# Patient Record
Sex: Male | Born: 1975 | Race: White | Marital: Married | Smoking: Never smoker
Health system: Southern US, Community
[De-identification: ages and names within clinical notes are randomized; demographics above are authoritative.]

## PROBLEM LIST (undated history)

## (undated) DIAGNOSIS — K5090 Crohn's disease, unspecified, without complications: Secondary | ICD-10-CM

## (undated) HISTORY — PX: COLON SURGERY: SHX602

---

## 2014-05-17 ENCOUNTER — Ambulatory Visit (HOSPITAL_COMMUNITY): Payer: PRIVATE HEALTH INSURANCE | Admitting: Psychology

## 2021-04-14 ENCOUNTER — Inpatient Hospital Stay (HOSPITAL_COMMUNITY)
Admission: EM | Admit: 2021-04-14 | Discharge: 2021-04-16 | DRG: 387 | Disposition: A | Discharge status: Home / Self Care | Payer: BC Managed Care – PPO | Attending: Internal Medicine | Admitting: Internal Medicine

## 2021-04-14 ENCOUNTER — Other Ambulatory Visit: Payer: Self-pay

## 2021-04-14 ENCOUNTER — Encounter (HOSPITAL_COMMUNITY): Payer: Self-pay

## 2021-04-14 DIAGNOSIS — E669 Obesity, unspecified: Secondary | ICD-10-CM | POA: Diagnosis present

## 2021-04-14 DIAGNOSIS — R739 Hyperglycemia, unspecified: Secondary | ICD-10-CM | POA: Diagnosis present

## 2021-04-14 DIAGNOSIS — Z20822 Contact with and (suspected) exposure to covid-19: Secondary | ICD-10-CM | POA: Diagnosis present

## 2021-04-14 DIAGNOSIS — Z6834 Body mass index (BMI) 34.0-34.9, adult: Secondary | ICD-10-CM

## 2021-04-14 DIAGNOSIS — Z951 Presence of aortocoronary bypass graft: Secondary | ICD-10-CM

## 2021-04-14 DIAGNOSIS — I2510 Atherosclerotic heart disease of native coronary artery without angina pectoris: Secondary | ICD-10-CM | POA: Diagnosis present

## 2021-04-14 DIAGNOSIS — K56609 Unspecified intestinal obstruction, unspecified as to partial versus complete obstruction: Secondary | ICD-10-CM | POA: Diagnosis present

## 2021-04-14 DIAGNOSIS — K50012 Crohn's disease of small intestine with intestinal obstruction: Principal | ICD-10-CM | POA: Diagnosis present

## 2021-04-14 DIAGNOSIS — D72829 Elevated white blood cell count, unspecified: Secondary | ICD-10-CM

## 2021-04-14 DIAGNOSIS — R112 Nausea with vomiting, unspecified: Secondary | ICD-10-CM

## 2021-04-14 DIAGNOSIS — R109 Unspecified abdominal pain: Secondary | ICD-10-CM

## 2021-04-14 HISTORY — DX: Crohn's disease, unspecified, without complications: K50.90

## 2021-04-14 MED ORDER — FENTANYL CITRATE PF 50 MCG/ML IJ SOSY
50.0000 ug | PREFILLED_SYRINGE | Freq: Once | INTRAMUSCULAR | Status: AC
  Administered 2021-04-14: 50 ug via INTRAVENOUS
  Filled 2021-04-14: qty 1

## 2021-04-14 MED ORDER — LACTATED RINGERS IV BOLUS
1000.0000 mL | Freq: Once | INTRAVENOUS | Status: AC
  Administered 2021-04-14: 1000 mL via INTRAVENOUS

## 2021-04-14 MED ORDER — ONDANSETRON HCL 4 MG/2ML IJ SOLN
4.0000 mg | Freq: Once | INTRAMUSCULAR | Status: AC
  Administered 2021-04-14: 4 mg via INTRAVENOUS
  Filled 2021-04-14: qty 2

## 2021-04-14 NOTE — ED Triage Notes (Signed)
Pt presents to Ed with abdominal pain that started yesterday. Pt has hx of Chron's disease, pt was taken off meds for this in 2008, which was his last flare up. Pt reports passing gas, last BM was yesterday. Pt became nauseated today and vomited a  couple of times.

## 2021-04-15 ENCOUNTER — Encounter (HOSPITAL_COMMUNITY): Payer: Self-pay | Admitting: Radiology

## 2021-04-15 ENCOUNTER — Emergency Department (HOSPITAL_COMMUNITY): Payer: BC Managed Care – PPO

## 2021-04-15 ENCOUNTER — Inpatient Hospital Stay (HOSPITAL_COMMUNITY): Payer: BC Managed Care – PPO

## 2021-04-15 DIAGNOSIS — Z6834 Body mass index (BMI) 34.0-34.9, adult: Secondary | ICD-10-CM | POA: Diagnosis not present

## 2021-04-15 DIAGNOSIS — D72829 Elevated white blood cell count, unspecified: Secondary | ICD-10-CM

## 2021-04-15 DIAGNOSIS — K56609 Unspecified intestinal obstruction, unspecified as to partial versus complete obstruction: Secondary | ICD-10-CM | POA: Diagnosis present

## 2021-04-15 DIAGNOSIS — R109 Unspecified abdominal pain: Secondary | ICD-10-CM

## 2021-04-15 DIAGNOSIS — Z951 Presence of aortocoronary bypass graft: Secondary | ICD-10-CM | POA: Diagnosis not present

## 2021-04-15 DIAGNOSIS — Z20822 Contact with and (suspected) exposure to covid-19: Secondary | ICD-10-CM | POA: Diagnosis present

## 2021-04-15 DIAGNOSIS — R112 Nausea with vomiting, unspecified: Secondary | ICD-10-CM

## 2021-04-15 DIAGNOSIS — I2510 Atherosclerotic heart disease of native coronary artery without angina pectoris: Secondary | ICD-10-CM | POA: Diagnosis present

## 2021-04-15 DIAGNOSIS — R1084 Generalized abdominal pain: Secondary | ICD-10-CM | POA: Diagnosis not present

## 2021-04-15 DIAGNOSIS — K50012 Crohn's disease of small intestine with intestinal obstruction: Secondary | ICD-10-CM | POA: Diagnosis present

## 2021-04-15 DIAGNOSIS — R739 Hyperglycemia, unspecified: Secondary | ICD-10-CM | POA: Diagnosis present

## 2021-04-15 DIAGNOSIS — E669 Obesity, unspecified: Secondary | ICD-10-CM | POA: Diagnosis present

## 2021-04-15 LAB — URINALYSIS, ROUTINE W REFLEX MICROSCOPIC
Bilirubin Urine: NEGATIVE
Glucose, UA: NEGATIVE mg/dL
Hgb urine dipstick: NEGATIVE
Ketones, ur: NEGATIVE mg/dL
Leukocytes,Ua: NEGATIVE
Nitrite: NEGATIVE
Protein, ur: NEGATIVE mg/dL
Specific Gravity, Urine: 1.032 — ABNORMAL HIGH (ref 1.005–1.030)
pH: 5 (ref 5.0–8.0)

## 2021-04-15 LAB — COMPREHENSIVE METABOLIC PANEL
ALT: 46 U/L — ABNORMAL HIGH (ref 0–44)
AST: 30 U/L (ref 15–41)
Albumin: 4.3 g/dL (ref 3.5–5.0)
Alkaline Phosphatase: 110 U/L (ref 38–126)
Anion gap: 10 (ref 5–15)
BUN: 17 mg/dL (ref 6–20)
CO2: 25 mmol/L (ref 22–32)
Calcium: 9.1 mg/dL (ref 8.9–10.3)
Chloride: 102 mmol/L (ref 98–111)
Creatinine, Ser: 1.2 mg/dL (ref 0.61–1.24)
GFR, Estimated: >60 mL/min (ref >60)
Glucose, Bld: 134 mg/dL — ABNORMAL HIGH (ref 70–99)
Potassium: 3.5 mmol/L (ref 3.5–5.1)
Sodium: 137 mmol/L (ref 135–145)
Total Bilirubin: 1.2 mg/dL (ref 0.3–1.2)
Total Protein: 7.7 g/dL (ref 6.5–8.1)

## 2021-04-15 LAB — CBC WITH DIFFERENTIAL/PLATELET
Abs Immature Granulocytes: 0.04 10*3/uL (ref 0.00–0.07)
Basophils Absolute: 0 10*3/uL (ref 0.0–0.1)
Basophils Relative: 0 %
Eosinophils Absolute: 0 10*3/uL (ref 0.0–0.5)
Eosinophils Relative: 0 %
HCT: 47.7 % (ref 39.0–52.0)
Hemoglobin: 16.7 g/dL (ref 13.0–17.0)
Immature Granulocytes: 0 %
Lymphocytes Relative: 18 %
Lymphs Abs: 2.7 10*3/uL (ref 0.7–4.0)
MCH: 31.3 pg (ref 26.0–34.0)
MCHC: 35 g/dL (ref 30.0–36.0)
MCV: 89.5 fL (ref 80.0–100.0)
Monocytes Absolute: 1.7 10*3/uL — ABNORMAL HIGH (ref 0.1–1.0)
Monocytes Relative: 11 %
Neutro Abs: 10.7 10*3/uL — ABNORMAL HIGH (ref 1.7–7.7)
Neutrophils Relative %: 71 %
Platelets: 291 10*3/uL (ref 150–400)
RBC: 5.33 MIL/uL (ref 4.22–5.81)
RDW: 12.8 % (ref 11.5–15.5)
WBC: 15.2 10*3/uL — ABNORMAL HIGH (ref 4.0–10.5)
nRBC: 0 % (ref 0.0–0.2)

## 2021-04-15 LAB — PROTIME-INR
INR: 1 (ref 0.8–1.2)
Prothrombin Time: 13.2 seconds (ref 11.4–15.2)

## 2021-04-15 LAB — LIPASE, BLOOD: Lipase: 29 U/L (ref 11–51)

## 2021-04-15 LAB — PHOSPHORUS: Phosphorus: 3.9 mg/dL (ref 2.5–4.6)

## 2021-04-15 LAB — RESP PANEL BY RT-PCR (FLU A&B, COVID) ARPGX2
Influenza A by PCR: NEGATIVE
Influenza B by PCR: NEGATIVE
SARS Coronavirus 2 by RT PCR: NEGATIVE

## 2021-04-15 LAB — MAGNESIUM: Magnesium: 1.7 mg/dL (ref 1.7–2.4)

## 2021-04-15 LAB — HIV ANTIBODY (ROUTINE TESTING W REFLEX): HIV Screen 4th Generation wRfx: NONREACTIVE

## 2021-04-15 LAB — APTT: aPTT: 25 seconds (ref 24–36)

## 2021-04-15 IMAGING — CT CT ABD-PELV W/ CM
2 of 5 series · 16 of 46 positions shown, 18 images · IV contrast (Omnipaque or Isovue)
Comparison: None.

CLINICAL DATA: Abdominal pain.  History of Crohn's.

EXAM:
CT ABDOMEN AND PELVIS WITH CONTRAST
TECHNIQUE: Multidetector CT imaging of the abdomen and pelvis was performed
using the standard protocol following bolus administration of
intravenous contrast.
CONTRAST:  100mL OMNIPAQUE IOHEXOL 300 MG/ML  SOLN

[Series 2: axial st · axial · 0.86mm/px · z∈[+768,+1258]mm · 13 of 110 slices shown, 15 images]
[im 6/110  soft-tissue]
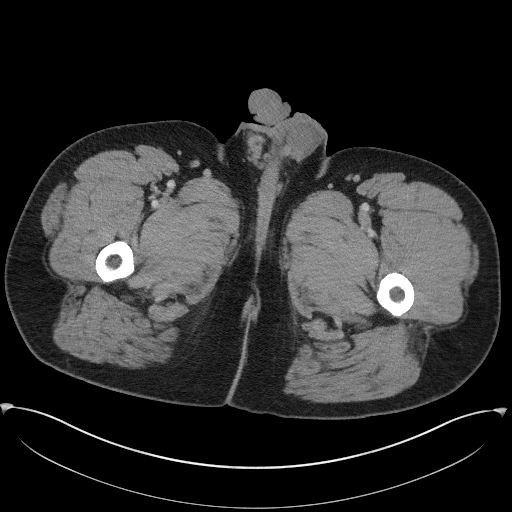
[im 6/110  bone]
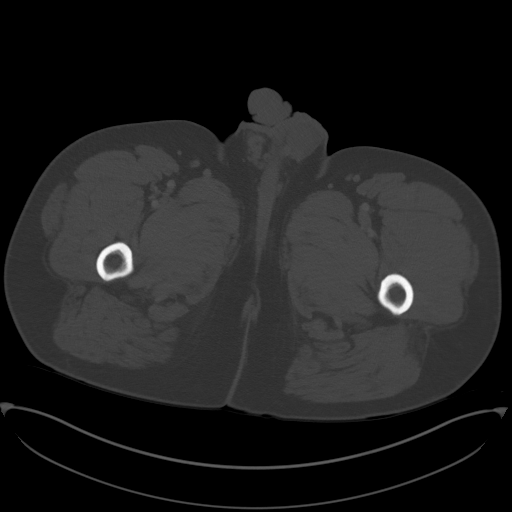
[im 18/110  soft-tissue]
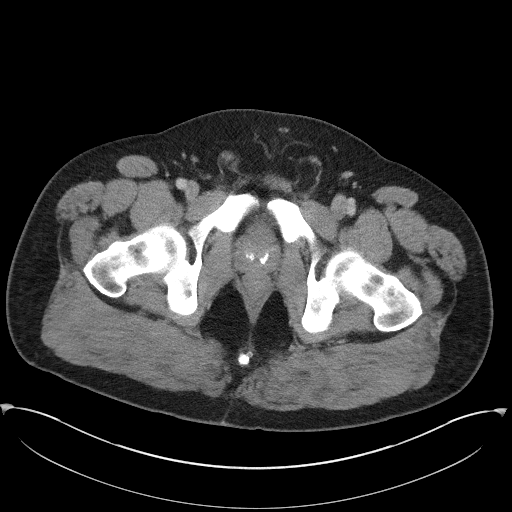
[im 23/110  soft-tissue]
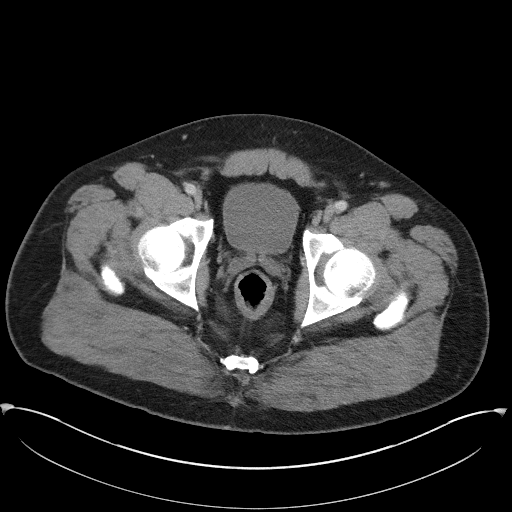
[im 29/110  soft-tissue]
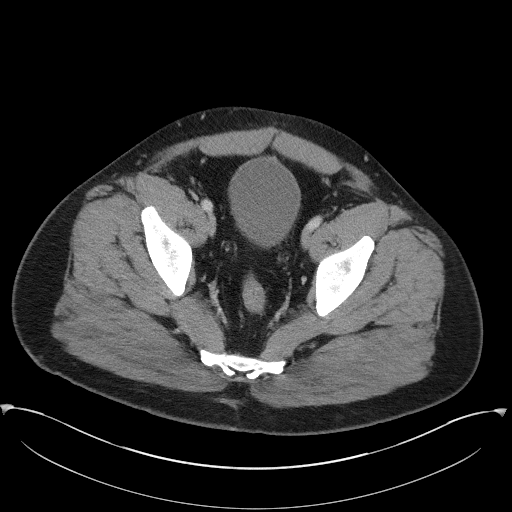
[im 41/110  soft-tissue]
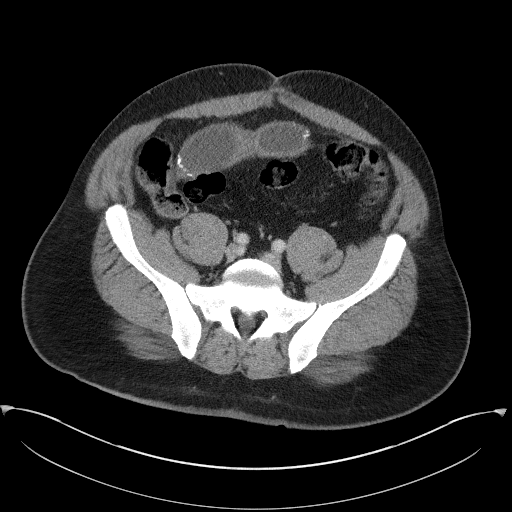
[im 46/110  soft-tissue]
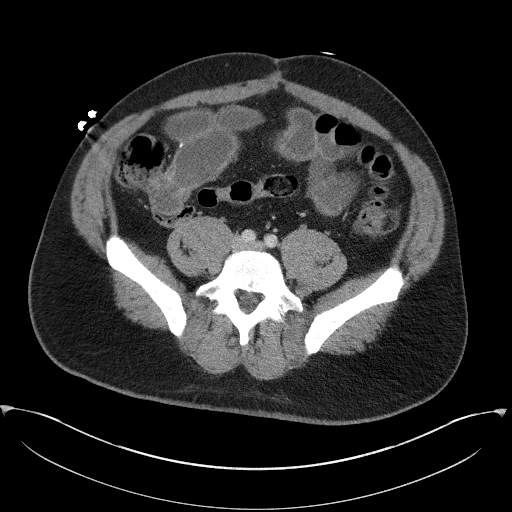
[im 58/110  soft-tissue]
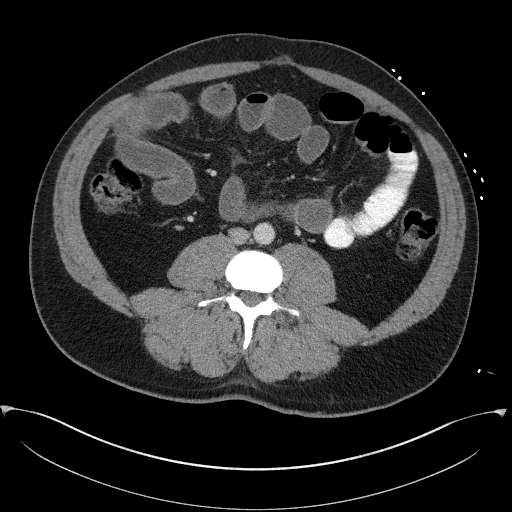
[im 64/110  soft-tissue]
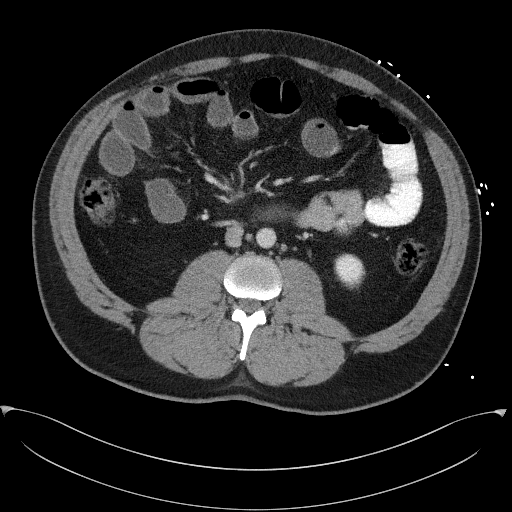
[im 69/110  soft-tissue]
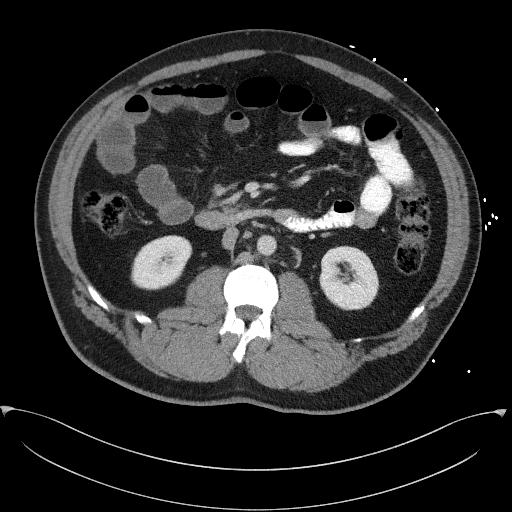
[im 69/110  bone]
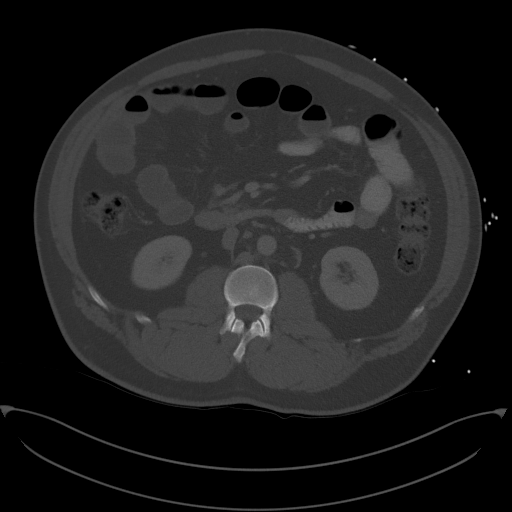
[im 81/110  soft-tissue]
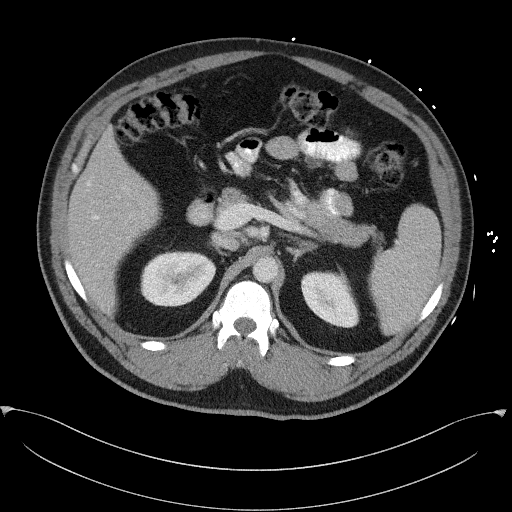
[im 87/110  soft-tissue]
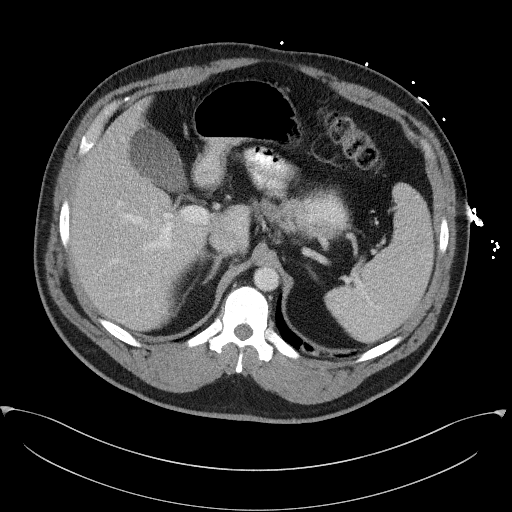
[im 92/110  soft-tissue]
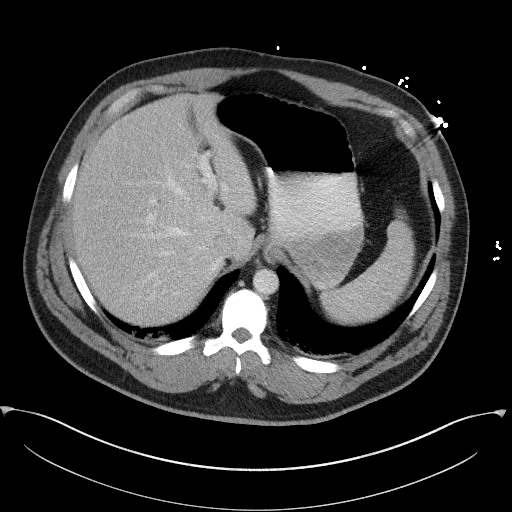
[im 104/110  soft-tissue]
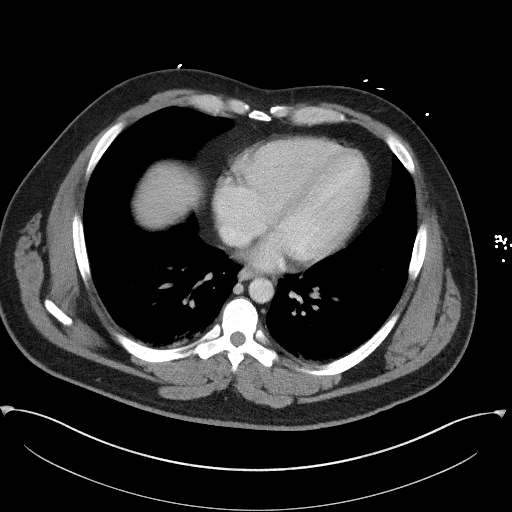

[Series 5: coronal st · coronal · 0.89mm/px · 3 of 134 slices shown]
[im 45/134  soft-tissue]
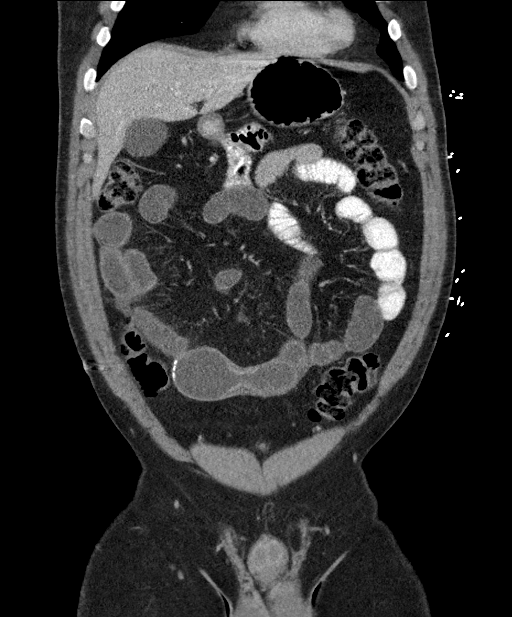
[im 60/134  soft-tissue]
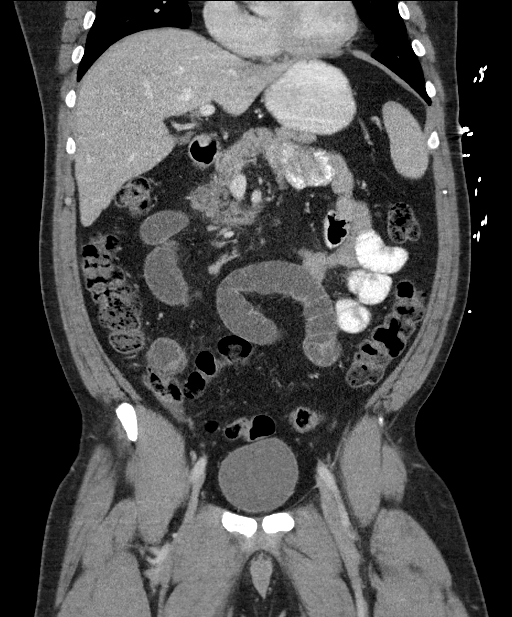
[im 74/134  soft-tissue]
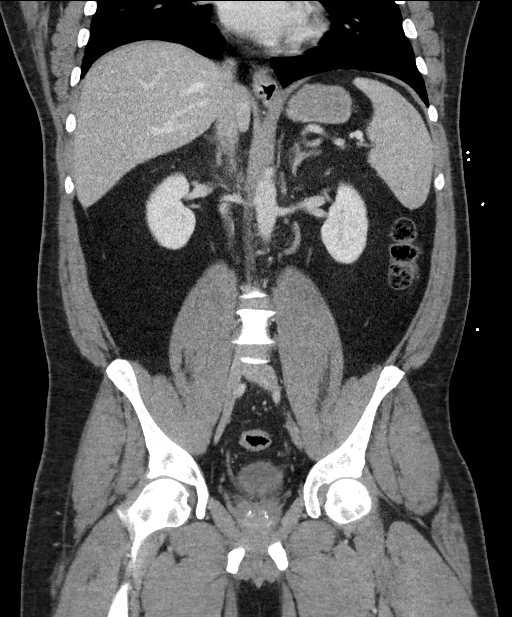

[16 of 46 positions shown; findings below may reference images not displayed]

FINDINGS: Lower chest: Minimal bibasilar dependent atelectasis. The visualized
lung bases are otherwise clear.

No intra-abdominal free air or free fluid.

Hepatobiliary: No focal liver abnormality is seen. No gallstones,
gallbladder wall thickening, or biliary dilatation.

Pancreas: Unremarkable. No pancreatic ductal dilatation or
surrounding inflammatory changes.

Spleen: Normal in size without focal abnormality.

Adrenals/Urinary Tract: The adrenal glands are unremarkable. The
kidneys, visualized ureters, and urinary bladder appear
unremarkable.

Stomach/Bowel: Postsurgical changes of bowel with anastomotic
suture. There is mild diffuse thickening of the small bowel wall in
the right hemiabdomen involving the mid and distal ileum as well as
terminal ileum. There is mild dilatation of these loops of small
bowel with fluid content. The are distal ileum measures up to 4.7 cm
in diameter. Findings concerning for recurrence of Crohn's disease
with a degree of obstruction at the level of the ileocecal valve.
The colon is unremarkable. The appendix is not visualized and may be
surgically absent.

Vascular/Lymphatic: The abdominal aorta and IVC unremarkable. No
portal venous gas. There is no adenopathy.

Reproductive: The prostate and seminal vesicles are grossly
unremarkable. No pelvic mass.

Other: None

Musculoskeletal: Midline vertical anterior pelvic wall incisional
scar. No acute osseous pathology.
IMPRESSION: Findings concerning for recurrence of Crohn's disease with a degree
of obstruction at the level of the ileocecal valve.

## 2021-04-15 IMAGING — DX DG CHEST 1V PORT
1 series · 2 of 2 positions shown · non-contrast
Comparison: CT Abdomen and Pelvis [40] hours today.

CLINICAL DATA: 45-year-old male NG tube placement.

EXAM:
PORTABLE CHEST 1 VIEW

[Series 2: chest ap grid · 0.14mm/px · 2 of 2 slices shown]
[im 1/2]
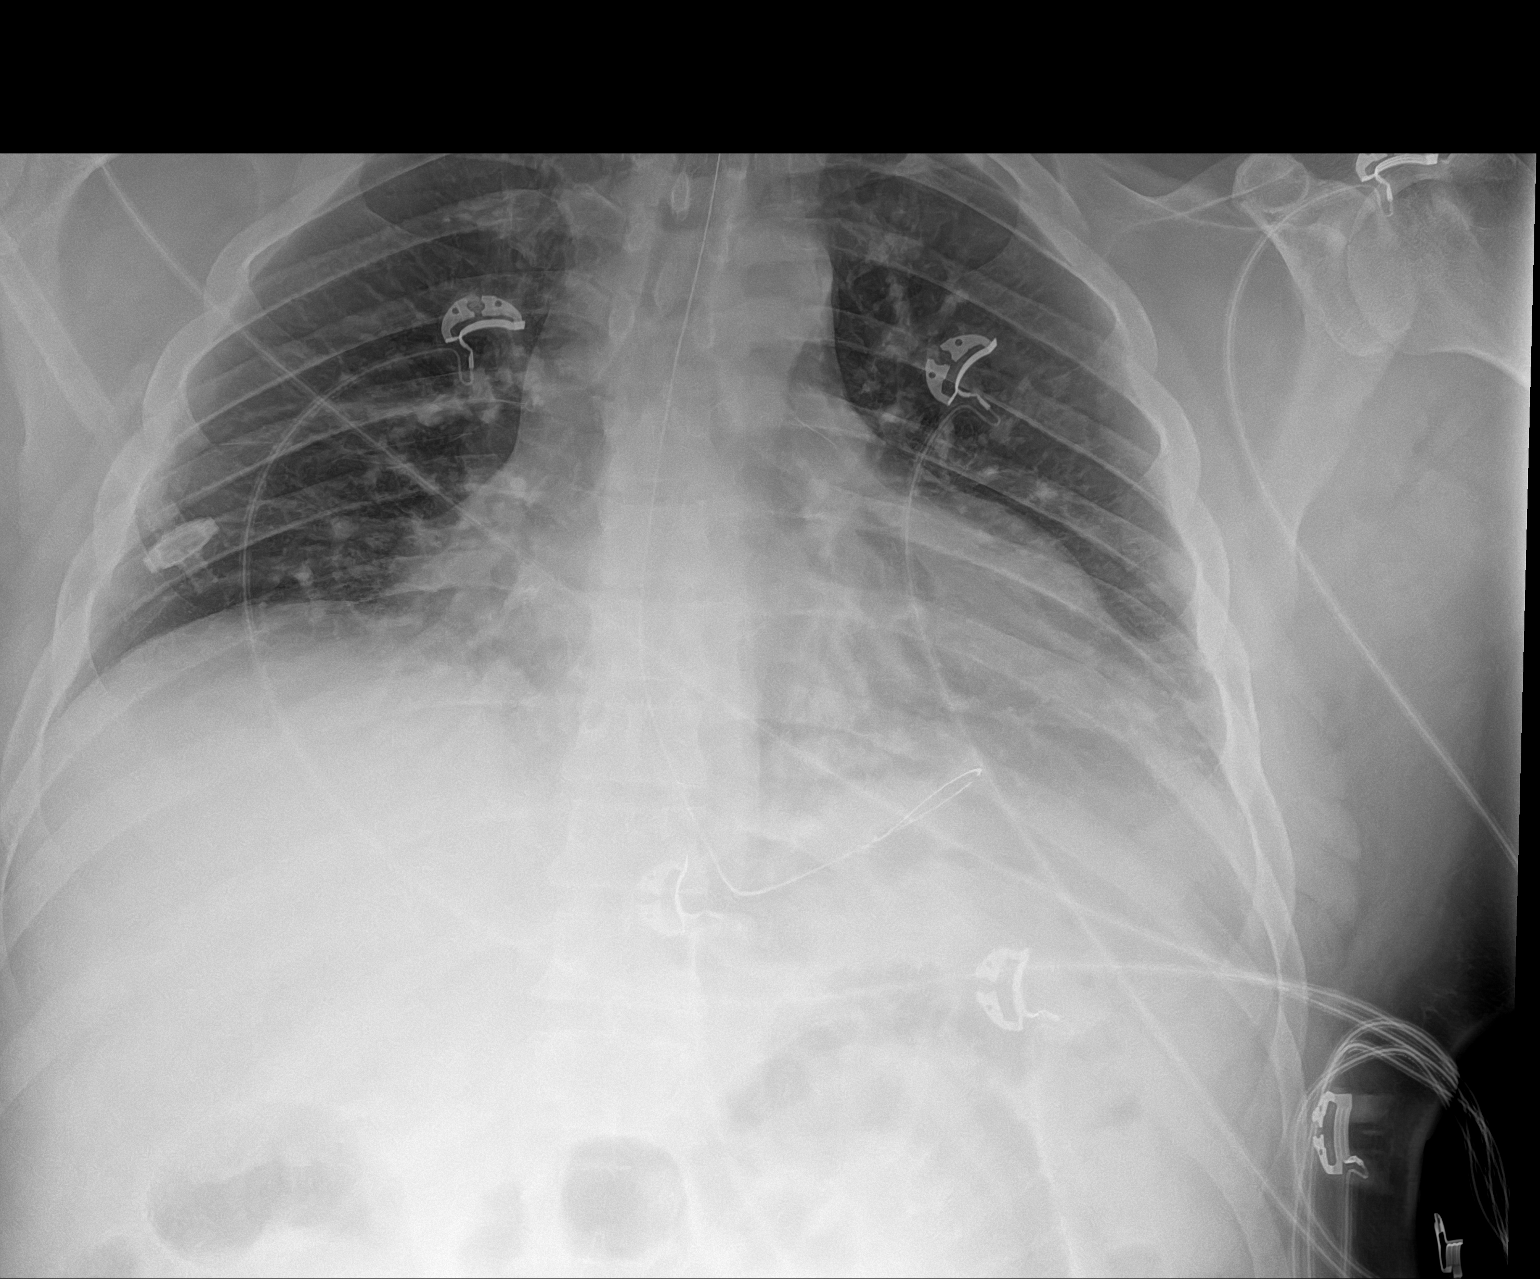
[im 2/2]
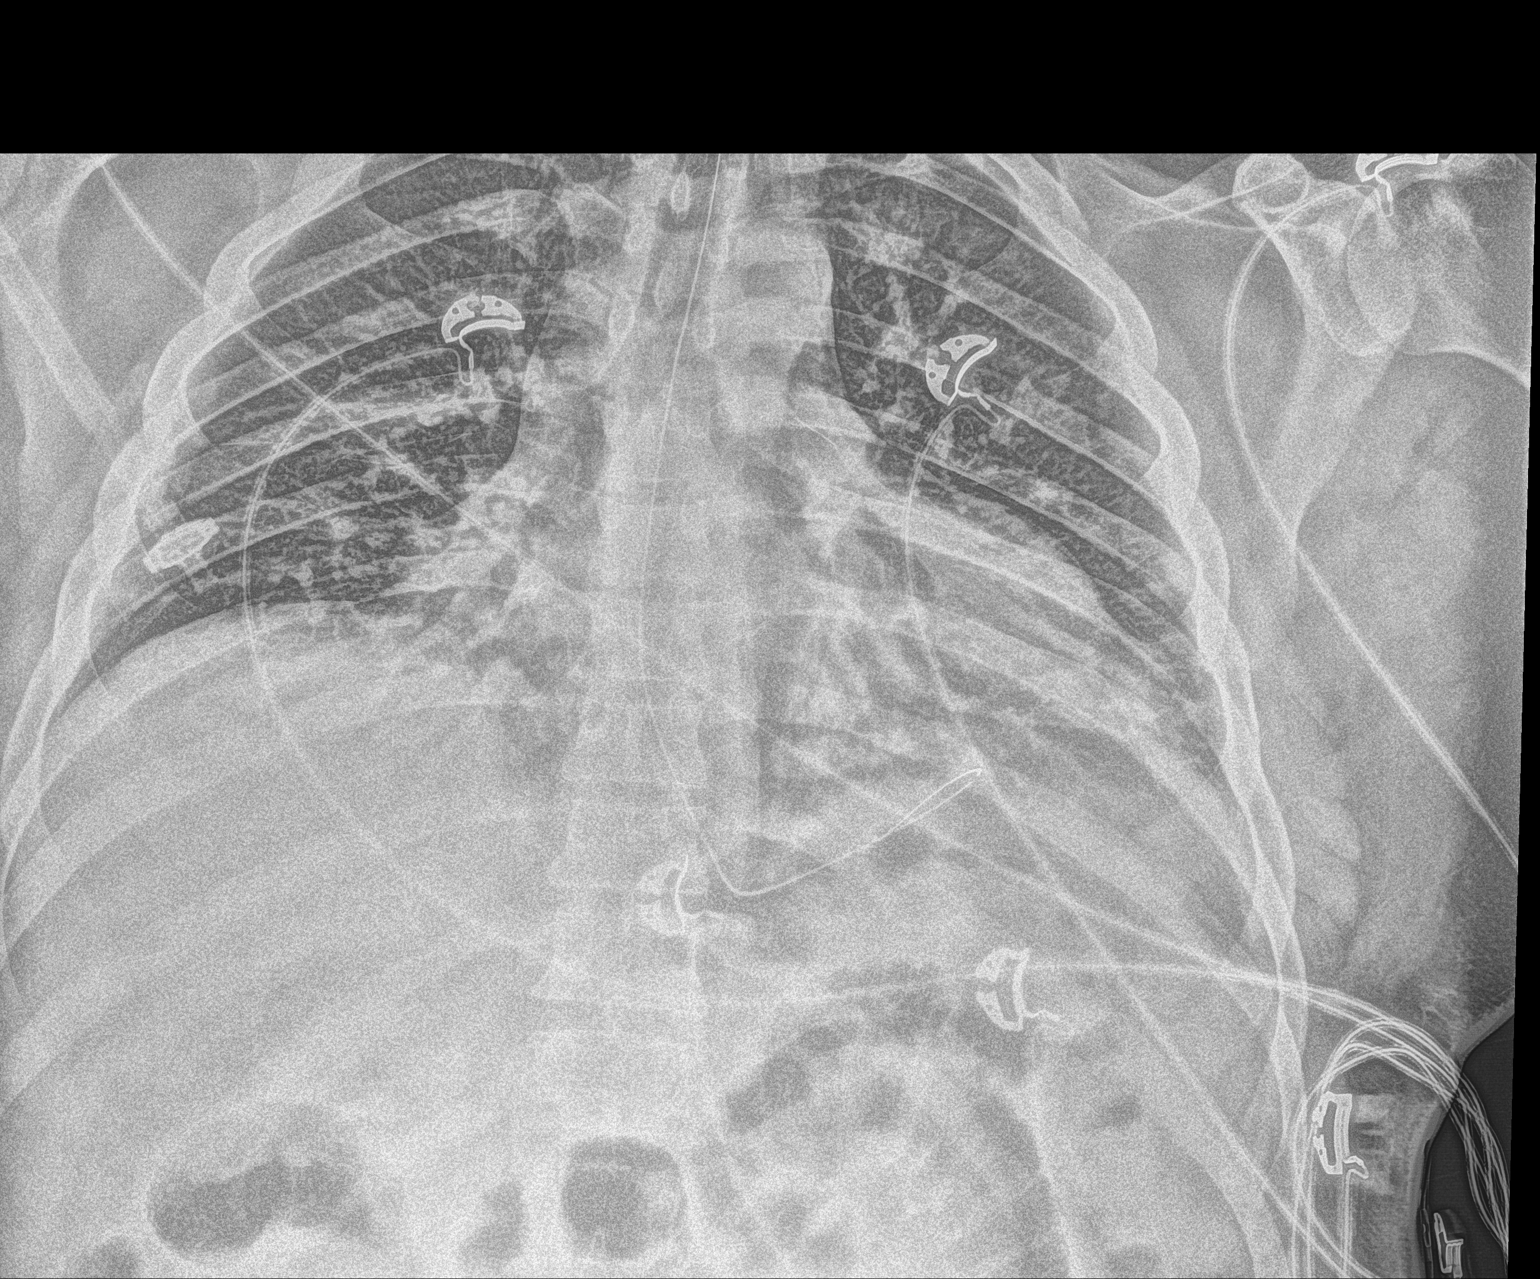

[2 of 2 positions shown; findings below may reference images not displayed]

FINDINGS: Portable AP upright view at [40] hours. Enteric tube placed into the
stomach, loops at the level of the gastric body. Low lung volumes.
Mild dependent atelectasis demonstrated on the earlier CT. But
Allowing for portable technique the lungs are clear. Normal cardiac
size and mediastinal contours. Visualized tracheal air column is
within normal limits. No pneumothorax. Negative visible bowel gas
pattern. No acute osseous abnormality identified.
IMPRESSION: 1. Satisfactory enteric tube placement into the stomach.
2. Low lung volumes with mild atelectasis.

## 2021-04-15 MED ORDER — METHYLPREDNISOLONE SODIUM SUCC 40 MG IJ SOLR
40.0000 mg | Freq: Two times a day (BID) | INTRAMUSCULAR | Status: DC
  Administered 2021-04-15 – 2021-04-16 (×2): 40 mg via INTRAVENOUS
  Filled 2021-04-15 (×2): qty 1

## 2021-04-15 MED ORDER — FENTANYL CITRATE PF 50 MCG/ML IJ SOSY
50.0000 ug | PREFILLED_SYRINGE | INTRAMUSCULAR | Status: DC | PRN
  Administered 2021-04-15: 50 ug via INTRAVENOUS
  Filled 2021-04-15: qty 1

## 2021-04-15 MED ORDER — ONDANSETRON HCL 4 MG/2ML IJ SOLN
4.0000 mg | Freq: Four times a day (QID) | INTRAMUSCULAR | Status: DC | PRN
Start: 2021-04-15 — End: 2021-04-16
  Administered 2021-04-15: 4 mg via INTRAVENOUS
  Filled 2021-04-15: qty 2

## 2021-04-15 MED ORDER — IOHEXOL 300 MG/ML  SOLN
100.0000 mL | Freq: Once | INTRAMUSCULAR | Status: AC | PRN
  Administered 2021-04-15: 100 mL via INTRAVENOUS

## 2021-04-15 MED ORDER — FENTANYL CITRATE PF 50 MCG/ML IJ SOSY: 50.0000 ug | PREFILLED_SYRINGE | INTRAMUSCULAR | Status: DC | PRN

## 2021-04-15 MED ORDER — LACTATED RINGERS IV SOLN: INTRAVENOUS | Status: DC

## 2021-04-15 MED ORDER — SODIUM CHLORIDE 0.9 % IV SOLN: Freq: Once | INTRAVENOUS | Status: AC

## 2021-04-15 MED ORDER — CIPROFLOXACIN IN D5W 400 MG/200ML IV SOLN
400.0000 mg | Freq: Two times a day (BID) | INTRAVENOUS | Status: DC
  Administered 2021-04-15 – 2021-04-16 (×2): 400 mg via INTRAVENOUS
  Filled 2021-04-15 (×2): qty 200

## 2021-04-15 MED ORDER — KETOROLAC TROMETHAMINE 30 MG/ML IJ SOLN
30.0000 mg | Freq: Once | INTRAMUSCULAR | Status: AC
  Administered 2021-04-15: 30 mg via INTRAVENOUS
  Filled 2021-04-15: qty 1

## 2021-04-15 MED ORDER — PANTOPRAZOLE SODIUM 40 MG PO TBEC
40.0000 mg | DELAYED_RELEASE_TABLET | Freq: Once | ORAL | Status: DC
  Filled 2021-04-15: qty 1

## 2021-04-15 MED ORDER — CALCIUM CARBONATE ANTACID 500 MG PO CHEW
1.0000 | CHEWABLE_TABLET | Freq: Two times a day (BID) | ORAL | Status: DC | PRN
  Administered 2021-04-15: 200 mg via ORAL
  Filled 2021-04-15: qty 1

## 2021-04-15 MED ORDER — PANTOPRAZOLE SODIUM 40 MG IV SOLR
40.0000 mg | Freq: Once | INTRAVENOUS | Status: AC
  Administered 2021-04-15: 40 mg via INTRAVENOUS
  Filled 2021-04-15: qty 40

## 2021-04-15 MED ORDER — METRONIDAZOLE 500 MG/100ML IV SOLN
500.0000 mg | Freq: Three times a day (TID) | INTRAVENOUS | Status: DC
  Administered 2021-04-15 – 2021-04-16 (×3): 500 mg via INTRAVENOUS
  Filled 2021-04-15 (×3): qty 100

## 2021-04-15 MED ORDER — LIDOCAINE HCL URETHRAL/MUCOSAL 2 % EX GEL
CUTANEOUS | Status: AC
  Filled 2021-04-15: qty 10

## 2021-04-15 MED ORDER — ENOXAPARIN SODIUM 40 MG/0.4ML IJ SOSY
40.0000 mg | PREFILLED_SYRINGE | INTRAMUSCULAR | Status: DC
  Administered 2021-04-15 – 2021-04-16 (×2): 40 mg via SUBCUTANEOUS
  Filled 2021-04-15 (×2): qty 0.4

## 2021-04-15 MED ORDER — LIDOCAINE HCL URETHRAL/MUCOSAL 2 % EX GEL
1.0000 "application " | Freq: Once | CUTANEOUS | Status: AC
  Administered 2021-04-15: 1

## 2021-04-15 NOTE — H&P (Signed)
History and Physical  Stephen Atkinson XLK:[id number] DOB: 07/07/1975 DOA: 04/14/2021  Referring physician: Zadie Rhine, MD PCP: Stephen Coss, MD  Patient coming from: Home  Chief Complaint: Abdominal pain  HPI: Stephen Atkinson is a 45 y.o. male with medical history significant for prior Crohn's disease s/p resection of terminal ileum (2006) who presents to the emergency department due to abdominal pain which started yesterday.  Patient states that he had some chili and shortly after that he developed abdominal pain which started in the epigastric area and extending to the lower quadrants, pain was described as soreness and was rated as 8-9/10 on pain scale.  This was associated with nausea and nonbloody vomiting x2.  There was no known alleviating/aggravating factor.  Last bowel movement was yesterday and was normal, he endorsed passing gas and denies diarrhea, fever, chills, shortness of breath, chest pain.  ED Course:  In the emergency department, was hemodynamically stable.  Work-up in the ED showed leukocytosis, hyperglycemia, elevated ALT. CT abdomen and pelvis with contrast showed findings concerning for recurrence of Crohn's disease with a degree of obstruction at the level of the ileocecal valve. IV fentanyl was given due to pain, Zofran was given due to nausea and vomiting.  NGT was ordered to be placed in the ED.  Hospitalist was asked to admit patient for further evaluation and management.    Review of Systems: Constitutional: Negative for chills and fever.  HENT: Negative for ear pain and sore throat.   Eyes: Negative for pain and visual disturbance.  Respiratory: Negative for cough, chest tightness and shortness of breath.   Cardiovascular: Negative for chest pain and palpitations.  Gastrointestinal: Positive for abdominal pain, nausea and vomiting. Endocrine: Negative for polyphagia and polyuria.  Genitourinary: Negative for decreased urine volume, dysuria,  enuresis Musculoskeletal: Negative for arthralgias and back pain.  Skin: Negative for color change and rash.  Allergic/Immunologic: Negative for immunocompromised state.  Neurological: Negative for tremors, syncope, speech difficulty, weakness, light-headedness and headaches.  Hematological: Does not bruise/bleed easily.  All other systems reviewed and are negative   Past Medical History:  Diagnosis Date   Crohn's disease [facility])    Past Surgical History:  Procedure Laterality Date   COLON SURGERY      Social History:  reports that he has never smoked. He has never used smokeless tobacco. He reports that he does not currently use alcohol. He reports that he does not currently use drugs.   No Known Allergies  No family history on file.   Prior to Admission medications   Not on File    Physical Exam: BP 109/71   Pulse 74   Temp 98.8 F (37.1 C) (Oral)   Resp 15   Ht 5\' 8"  (1.727 m)   Wt 103.4 kg   SpO2 92%   BMI 34.67 kg/m   General: 45 y.o. year-old male well developed well nourished in no acute distress.  Alert and oriented x3. HEENT: NCAT, EOMI Neck: Supple, trachea medial Cardiovascular: Regular rate and rhythm with no rubs or gallops.  No thyromegaly or JVD noted.  No lower extremity edema. 2/4 pulses in all 4 extremities. Respiratory: Clear to auscultation with no wheezes or rales. Good inspiratory effort. Abdomen: Soft, mild tenderness in RLQ without guarding.  Nondistended with normal bowel sounds x4 quadrants. Muskuloskeletal: No cyanosis, clubbing or edema noted bilaterally Neuro: CN II-XII intact, strength 5/5 x 4, sensation, reflexes intact Skin: No ulcerative lesions noted or rashes Psychiatry: Judgement and insight appear normal.  Mood is appropriate for condition and setting          Labs on Admission:  Basic Metabolic Panel: Recent Labs  Lab 04/14/21 2330  NA 137  K 3.5  CL 102  CO2 25  GLUCOSE 134*  BUN 17  CREATININE 1.20  CALCIUM 9.1    Liver Function Tests: Recent Labs  Lab 04/14/21 2330  AST 30  ALT 46*  ALKPHOS 110  BILITOT 1.2  PROT 7.7  ALBUMIN 4.3   Recent Labs  Lab 04/14/21 2330  LIPASE 29   No results for input(s): AMMONIA in the last 168 hours. CBC: Recent Labs  Lab 04/14/21 2330  WBC 15.2*  NEUTROABS 10.7*  HGB 16.7  HCT 47.7  MCV 89.5  PLT 291   Cardiac Enzymes: No results for input(s): CKTOTAL, CKMB, CKMBINDEX, TROPONINI in the last 168 hours.  BNP (last 3 results) No results for input(s): BNP in the last 8760 hours.  ProBNP (last 3 results) No results for input(s): PROBNP in the last 8760 hours.  CBG: No results for input(s): GLUCAP in the last 168 hours.  Radiological Exams on Admission: CT ABDOMEN PELVIS W CONTRAST  Result Date: 04/15/2021 CLINICAL DATA:  Abdominal pain.  History of Crohn's. EXAM: CT ABDOMEN AND PELVIS WITH CONTRAST TECHNIQUE: Multidetector CT imaging of the abdomen and pelvis was performed using the standard protocol following bolus administration of intravenous contrast. CONTRAST:  OMNIPAQUE IOHEXOL 300 MG/ML  SOLN COMPARISON:  None. FINDINGS: Lower chest: Minimal bibasilar dependent atelectasis. The visualized lung bases are otherwise clear. No intra-abdominal free air or free fluid. Hepatobiliary: No focal liver abnormality is seen. No gallstones, gallbladder wall thickening, or biliary dilatation. Pancreas: Unremarkable. No pancreatic ductal dilatation or surrounding inflammatory changes. Spleen: Normal in size without focal abnormality. Adrenals/Urinary Tract: The adrenal glands are unremarkable. The kidneys, visualized ureters, and urinary bladder appear unremarkable. Stomach/Bowel: Postsurgical changes of bowel with anastomotic suture. There is mild diffuse thickening of the small bowel wall in the right hemiabdomen involving the mid and distal ileum as well as terminal ileum. There is mild dilatation of these loops of small bowel with fluid content. The  are distal ileum measures up to 4.7 cm in diameter. Findings concerning for recurrence of Crohn's disease with a degree of obstruction at the level of the ileocecal valve. The colon is unremarkable. The appendix is not visualized and may be surgically absent. Vascular/Lymphatic: The abdominal aorta and IVC unremarkable. No portal venous gas. There is no adenopathy. Reproductive: The prostate and seminal vesicles are grossly unremarkable. No pelvic mass. Other: None Musculoskeletal: Midline vertical anterior pelvic wall incisional scar. No acute osseous pathology. IMPRESSION: Findings concerning for recurrence of Crohn's disease with a degree of obstruction at the level of the ileocecal valve. Electronically Signed   By: Elgie Collard M.D.   On: 04/15/2021 03:22    EKG: I independently viewed the EKG done and my findings are as followed: Normal sinus rhythm at a rate of 85 bpm  Assessment/Plan Present on Admission:  Small bowel obstruction (HCC)  Active Problems:   Small bowel obstruction (HCC)   Abdominal pain   Nausea & vomiting   Leukocytosis   Hyperglycemia   Abdominal pain, nausea and vomiting secondary to small bowel obstruction IV fentanyl, Zofran and Protonix were given Continue IV NS at 100 mLs/Hr Continue IV fentanyl 50 mcg every 2 hours as needed p.r.n. for moderate to severe pain Continue IV Zofran p.r.n. Continue n.p.o. and continue NG tube  Possible  Crohn's disease recurrence CT abdomen and pelvis was suggestive of possible Crohn's recurrence Gastroenterology will be consulted to follow up with patient in the morning  Leukocytosis possibly reactive WBC 15.2, patient currently without fever, chills or any other obvious acute infectious process at this time Continue to monitor patient and treat accordingly  Hyperglycemia possibly reactive CBG 134; continue to monitor blood glucose level  Elevated ALT possibly reactive ALT 46, continue to monitor liver enzymes  DVT  prophylaxis: Lovenox  Code Status: Full code  Family Communication: None at bedside  Disposition Plan:  Patient is from:                        home Anticipated DC to:                   SNF or family members home Anticipated DC date:               2-3 days Anticipated DC barriers:          Patient requires inpatient management due to small bowel obstruction requiring NG tube and pending gastroenterology consult for possible Crohn's recurrence  Consults called: Gastroenterology  Admission status: Inpatient    Frankey Shown MD [facility]  04/15/2021, 4:10 AM

## 2021-04-15 NOTE — ED Notes (Signed)
Pt returned from CT Scan 

## 2021-04-15 NOTE — Progress Notes (Signed)
IV fluids switched to LR, suction to NG turned off and requested apple juice.

## 2021-04-15 NOTE — Consult Note (Addendum)
Referring Provider: Maurilio Lovely, DO Primary Care Physician:  Maximiano Coss, MD Primary Gastroenterologist:  Dr. Karilyn Cota  Reason for Consultation:    Abdominal pain nausea and vomiting in a patient with history of Crohn's disease.  HPI:   Patient is 45 year old Caucasian male who has a history of Crohn's disease details of which are reviewed under past medical history was in usual state of health until 2 days ago.  He had chili at supper.  He did not eat much.  He woke up around 4 AM with nausea and abdominal cramping.  As the day progressed his nausea and cramping got worse and he began to throw up.  In the meantime he just started to drink liquids.  As he was not getting better he came to emergency room last evening.  Lab studies are pertinent for leukocytosis.  WBC was 15..  Comprehensive chemistry panel was pertinent for glucose of 134 and ALT of 46.  AST was normal.  Serum lipase was normal.  Abdominal pelvic CT was obtained with contrast.  This study revealed dilated loops of small bowel proximal to thickened ileal segment proximal to anastomosis.  These findings were felt to be suspicious for Crohn's disease.  Patient was begun on IV fluids. GI consultation was recommended by Dr. Sherryll Burger and I recommended starting patient on Solu-Medrol and antibiotics. Patient feels better this morning.  He has been passing flatus and he also has been passing liquid stool.  No melena or rectal bleeding.  Patient states he has been taking ibuprofen 40 mg 2-3 times a week for musculoskeletal pain.  His appetite has been good.  He has not experienced unintentional weight loss.  He states he and his son had mild flulike symptoms 1 week ago and did not require any intervention.  No history of skin rash.  He used to get oral aphthous ulcers often but not lately. Patient states he has been under a lot of stress as the company that he works for is expanding and he has been stressful year.  He does not do any regular  physical activity.   Patient is married.  He has 2 sons.  Younger son is in good health.  Older son is highly functional autistic child.  Patient does not smoke cigarettes or drink alcohol.  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  While in school and college he used to run.  He would run as much as 100 miles a week and then he went to [employer] and his physical activity gradually decreased and eventually stopped.  He says he weighed 135 pounds when he graduated from high school.  He now weighs 228 pounds. Father is 108 years old.  He has coronary artery disease.  He had CABG 4 years ago and is doing fine.  Mother is 71 years old.  He has 2 younger siblings and they are in good health.  Extended family history negative for inflammatory bowel disease.   Past Medical History:  Diagnosis Date   Crohn's disease (HCC) he was diagnosed with Crohn's disease when he had surgery 16 years ago in 2006.  He says his appendix was also removed as it was necrotic.  He was treated with steroids.  He had relapse 9 months later requiring hospitalization.  He was then treated with azathioprine which was discontinued in 2010 when colonoscopy revealed an active disease.  He was living in [place] at that time.  His last  colonoscopy was in 2017 or 2018 at [facility] and was normal.        Obesity.  Past Surgical History:  Procedure Laterality Date   COLON SURGERY      Prior to Admission medications   Medication Sig Start Date End Date Taking? Authorizing Provider  L-Lysine 500 MG TABS Take 1 tablet by mouth daily. 12/30/09  Yes [provider]  Multiple Vitamin (MULTIVITAMINS PO) Take 1 tablet by mouth daily. 12/30/09  Yes [provider]  Omega-3 Fatty Acids (FISH OIL PO) Take 1 tablet by mouth daily. 12/30/09  Yes [provider]    Current Facility-Administered Medications  Medication Dose Route Frequency Provider Last Rate Last Admin    ciprofloxacin (CIPRO) IVPB 400 mg  400 mg Intravenous Q12H Shah, Pratik D, DO       enoxaparin (LOVENOX) injection 40 mg  40 mg Subcutaneous Q24H Adefeso, Oladapo, DO   40 mg at 04/15/21 0546   fentaNYL (SUBLIMAZE) injection 50 mcg  50 mcg Intravenous Q2H PRN Adefeso, Oladapo, DO       lactated ringers infusion   Intravenous Continuous Maurilio Lovely D, DO 75 mL/hr at 04/15/21 1034 New Bag at 04/15/21 1034   methylPREDNISolone sodium succinate (SOLU-MEDROL) 40 mg/mL injection 40 mg  40 mg Intravenous Q12H Shah, Pratik D, DO       metroNIDAZOLE (FLAGYL) IVPB 500 mg  500 mg Intravenous Q8H Shah, Pratik D, DO       ondansetron (ZOFRAN) injection 4 mg  4 mg Intravenous Q6H PRN Adefeso, Oladapo, DO   4 mg at 04/15/21 0403    Allergies as of 04/14/2021   (No Known Allergies)    No family history on file.  Social History   Socioeconomic History   Marital status: Married    Spouse name: Not on file   Number of children: Not on file   Years of education: Not on file   Highest education level: Not on file  Occupational History   Not on file  Tobacco Use   Smoking status: Never   Smokeless tobacco: Never  Vaping Use   Vaping Use: Never used  Substance and Sexual Activity   Alcohol use: Not Currently   Drug use: Not Currently   Sexual activity: Not on file  Other Topics Concern   Not on file  Social History Narrative   Not on file   Social Determinants of Health   Financial Resource Strain: Not on file  Food Insecurity: Not on file  Transportation Needs: Not on file  Physical Activity: Not on file  Stress: Not on file  Social Connections: Not on file  Intimate Partner Violence: Not on file    Review of Systems: See HPI, otherwise normal ROS  Physical Exam: Temp:  [97.4 F (36.3 C)-98.8 F (37.1 C)] 97.4 F (36.3 C) (10/22 0800) Pulse Rate:  [62-107] 76 (10/22 0800) Resp:  [15-23] 18 (10/22 0800) BP: (101-132)/(56-92) 120/80 (10/22 0800) SpO2:  [92 %-97 %] 95 % (10/22  0800) Weight:  [103.4 kg] 103.4 kg (10/21 2307) Last BM Date: 04/13/21  Patient is alert and in no acute distress.  He has NG tube in place.  A scant amount of bilious fluid in the container. Conjunctivae is pink.  Sclera is nonicteric.  Pupils are equal and reactive to light. Oropharyngeal mucosa is normal. No neck masses or thyromegaly noted. Cardiac exam with regular rhythm normal S1 and S2.  No murmur gallop noted. Auscultation lungs reveal vesicular  breath sounds bilaterally. Abdomen is full.  Lower midline scar.  Bowel sounds are normal.  On palpation abdomen is somewhat tense but not tender.  No organomegaly or masses.  Percussion note is normal. No peripheral edema skin rash or clubbing noted.    Intake/Output from previous day: 10/21 0701 - 10/22 0700 In: 1000 [IV Piggyback:1000] Out: -  Intake/Output this shift: Total I/O In: 240 [P.O.:240] Out: 650 [Urine:350; Other:300]  Lab Results: Recent Labs    04/14/21 2330  WBC 15.2*  HGB 16.7  HCT 47.7  PLT 291   BMET Recent Labs    04/14/21 2330  NA 137  K 3.5  CL 102  CO2 25  GLUCOSE 134*  BUN 17  CREATININE 1.20  CALCIUM 9.1   LFT Recent Labs    04/14/21 2330  PROT 7.7  ALBUMIN 4.3  AST 30  ALT 46*  ALKPHOS 110  BILITOT 1.2   PT/INR Recent Labs    04/15/21 0524  LABPROT 13.2  INR 1.0   Hepatitis Panel No results for input(s): HEPBSAG, HCVAB, HEPAIGM, HEPBIGM in the last 72 hours.  Studies/Results: CT ABDOMEN PELVIS W CONTRAST  Result Date: 04/15/2021 CLINICAL DATA:  Abdominal pain.  History of Crohn's. EXAM: CT ABDOMEN AND PELVIS WITH CONTRAST TECHNIQUE: Multidetector CT imaging of the abdomen and pelvis was performed using the standard protocol following bolus administration of intravenous contrast. CONTRAST:  OMNIPAQUE IOHEXOL 300 MG/ML  SOLN COMPARISON:  None. FINDINGS: Lower chest: Minimal bibasilar dependent atelectasis. The visualized lung bases are otherwise clear. No  intra-abdominal free air or free fluid. Hepatobiliary: No focal liver abnormality is seen. No gallstones, gallbladder wall thickening, or biliary dilatation. Pancreas: Unremarkable. No pancreatic ductal dilatation or surrounding inflammatory changes. Spleen: Normal in size without focal abnormality. Adrenals/Urinary Tract: The adrenal glands are unremarkable. The kidneys, visualized ureters, and urinary bladder appear unremarkable. Stomach/Bowel: Postsurgical changes of bowel with anastomotic suture. There is mild diffuse thickening of the small bowel wall in the right hemiabdomen involving the mid and distal ileum as well as terminal ileum. There is mild dilatation of these loops of small bowel with fluid content. The are distal ileum measures up to 4.7 cm in diameter. Findings concerning for recurrence of Crohn's disease with a degree of obstruction at the level of the ileocecal valve. The colon is unremarkable. The appendix is not visualized and may be surgically absent. Vascular/Lymphatic: The abdominal aorta and IVC unremarkable. No portal venous gas. There is no adenopathy. Reproductive: The prostate and seminal vesicles are grossly unremarkable. No pelvic mass. Other: None Musculoskeletal: Midline vertical anterior pelvic wall incisional scar. No acute osseous pathology. IMPRESSION: Findings concerning for recurrence of Crohn's disease with a degree of obstruction at the level of the ileocecal valve. Electronically Signed   By: Elgie Collard M.D.   On: 04/15/2021 03:22   DG Chest Portable 1 View  Result Date: 04/15/2021 CLINICAL DATA:  45 year old male NG tube placement. EXAM: PORTABLE CHEST 1 VIEW COMPARISON:  CT Abdomen and Pelvis 0256 hours today. FINDINGS: Portable AP upright view at 0450 hours. Enteric tube placed into the stomach, loops at the level of the gastric body. Low lung volumes. Mild dependent atelectasis demonstrated on the earlier CT. But Allowing for portable technique the lungs are  clear. Normal cardiac size and mediastinal contours. Visualized tracheal air column is within normal limits. No pneumothorax. Negative visible bowel gas pattern. No acute osseous abnormality identified. IMPRESSION: 1. Satisfactory enteric tube placement into the stomach. 2. Low lung  volumes with mild atelectasis. Electronically Signed   By: Odessa Fleming M.D.   On: 04/15/2021 05:37    Assessment;  #1.  Patient is 45 year old Caucasian male with history of small bowel Crohn's disease status post surgery in 2006 who was on azathioprine until 2010 and has remained in remission off treatment now presents with sudden onset of nausea abdominal cramping and vomiting and his stool has been loose this morning.  CT reveals wall thickening to distal small bowel and dilation upstream. Patient recalls he had his son had flulike symptoms 1 week ago but did not require any therapy.  He takes ibuprofen 400 mg 2-3 times a day for musculoskeletal pain. Patient's acute illness most likely due to relapse of Crohn's disease but need to rule out enteric infection. As discussed with Dr. Sherryll Burger he is being treated with methylprednisolone, Cipro and metronidazole. If he tolerates clear liquids NG tube will be removed later today.  #2.  ALT is mildly elevated.  Liver does not appear echogenic but mildly elevated ALT most likely due to fatty liver.  Recommendations;  Will remove NG tube if he does not experience cramping or nausea with clear liquids. Will send stool specimen for GI pathogen panel and fecal calprotectin. Request colonoscopy records from [facility] Repeat LFTs in AM.    LOS: 0 days   Chany Woolworth  04/15/2021, 12:35 PM

## 2021-04-15 NOTE — ED Notes (Signed)
ED Provider at bedside. 

## 2021-04-15 NOTE — Progress Notes (Signed)
Stephen Atkinson is a 45 y.o. male with medical history significant for prior Crohn's disease s/p resection of terminal ileum (2006) who presents to the emergency department due to abdominal pain which started yesterday.  CT scan demonstrated findings concerning for recurrence of Crohn's disease with a degree of obstruction at the level of the ileocecal valve.  He was placed on NG tube with low intermittent suction as well as IV medications.  He is awaiting GI evaluation for further assistance and treatment.  Abdominal pain with intractable nausea and vomiting secondary to partial SBO in the setting of Crohn's flare -Appreciate GI assistance, has not had flare in over 15 years and is not currently on medication -Continue IV fluid -IV pain medications and antiemetics -Leave NG tube and stop suction, start clear liquids -Passing flatus with no BM  Leukocytosis-likely reactive secondary to above -Continue to monitor  Hyperglycemia-likely reactive -Continue monitoring  Father at bedside 10/22.  Total care time: 30 minutes.

## 2021-04-15 NOTE — ED Provider Notes (Signed)
[facility] EMERGENCY DEPARTMENT Provider Note   CSN: [id number] Arrival date & time: 04/14/21  2251     History Chief Complaint  Patient presents with   Abdominal Pain    Stephen Atkinson is a 45 y.o. male.  The history is provided by the patient.  Abdominal Pain Pain location:  RLQ Pain quality: aching and cramping   Pain radiates to:  Does not radiate Pain severity:  Moderate Onset quality:  Gradual Duration:  1 day Timing:  Constant Progression:  Worsening Chronicity:  New Relieved by:  Nothing Worsened by:  Movement and palpation Associated symptoms: nausea and vomiting   Associated symptoms: no chest pain, no diarrhea, no dysuria, no fever, no hematemesis, no hematochezia and no melena   Patient reports previous history of Crohn's disease.  Patient reports he had portion of his ileum removed in 2006.  He reports he rarely has abdominal pain.  This episode began yesterday and is worsening.  He reports associated nausea and vomiting.  He had a normal bowel movement yesterday without diarrhea, no bloody stool.  He has not passed any stool today, but is passing gas.    Past Medical History:  Diagnosis Date   Crohn's disease (HCC)     There are no problems to display for this patient.   Past Surgical History:  Procedure Laterality Date   COLON SURGERY         No family history on file.  Social History   Tobacco Use   Smoking status: Never   Smokeless tobacco: Never  Vaping Use   Vaping Use: Never used  Substance Use Topics   Alcohol use: Not Currently   Drug use: Not Currently    Home Medications Prior to Admission medications   Not on File    Allergies    Patient has no known allergies.  Review of Systems   Review of Systems  Constitutional:  Negative for fever.  Cardiovascular:  Negative for chest pain.  Gastrointestinal:  Positive for abdominal pain, nausea and vomiting. Negative for diarrhea, hematemesis, hematochezia and melena.   Genitourinary:  Negative for dysuria and testicular pain.  All other systems reviewed and are negative.  Physical Exam Updated Vital Signs BP 109/71   Pulse 74   Temp 98.8 F (37.1 C) (Oral)   Resp 15   Ht 1.727 m (5\' 8" )   Wt 103.4 kg   SpO2 92%   BMI 34.67 kg/m   Physical Exam CONSTITUTIONAL: Well developed/well nourished, uncomfortable appearing HEAD: Normocephalic/atraumatic EYES: EOMI/PERRL, no icterus NECK: supple no meningeal signs SPINE/BACK:entire spine nontender CV: S1/S2 noted, no murmurs/rubs/gallops noted LUNGS: Lungs are clear to auscultation bilaterally, no apparent distress ABDOMEN: soft, moderate RLQ tenderness, no rebound or guarding, bowel sounds noted throughout abdomen, patient appears distended GU:no cva tenderness NEURO: Pt is awake/alert/appropriate, moves all extremitiesx4.  No facial droop.   EXTREMITIES: pulses normal/equal, full ROM SKIN: warm, color normal PSYCH: no abnormalities of mood noted, alert and oriented to situation  ED Results / Procedures / Treatments   Labs (all labs ordered are listed, but only abnormal results are displayed) Labs Reviewed  CBC WITH DIFFERENTIAL/PLATELET - Abnormal; Notable for the following components:      Result Value   WBC 15.2 (*)    Neutro Abs 10.7 (*)    Monocytes Absolute 1.7 (*)    All other components within normal limits  COMPREHENSIVE METABOLIC PANEL - Abnormal; Notable for the following components:   Glucose, Bld 134 (*)  ALT 46 (*)    All other components within normal limits  RESP PANEL BY RT-PCR (FLU A&B, COVID) ARPGX2  LIPASE, BLOOD  URINALYSIS, ROUTINE W REFLEX MICROSCOPIC    EKG EKG Interpretation  Date/Time:  Friday April 14 2021 23:31:11 EDT Ventricular Rate:  85 PR Interval:  191 QRS Duration: 88 QT Interval:  352 QTC Calculation: 419 R Axis:   9 Text Interpretation: Sinus rhythm Ventricular premature complex No previous ECGs available Confirmed by Zadie Rhine  [id number]) on 04/14/2021 11:43:34 PM  Radiology CT ABDOMEN PELVIS W CONTRAST  Result Date: 04/15/2021 CLINICAL DATA:  Abdominal pain.  History of Crohn's. EXAM: CT ABDOMEN AND PELVIS WITH CONTRAST TECHNIQUE: Multidetector CT imaging of the abdomen and pelvis was performed using the standard protocol following bolus administration of intravenous contrast. CONTRAST:  OMNIPAQUE IOHEXOL 300 MG/ML  SOLN COMPARISON:  None. FINDINGS: Lower chest: Minimal bibasilar dependent atelectasis. The visualized lung bases are otherwise clear. No intra-abdominal free air or free fluid. Hepatobiliary: No focal liver abnormality is seen. No gallstones, gallbladder wall thickening, or biliary dilatation. Pancreas: Unremarkable. No pancreatic ductal dilatation or surrounding inflammatory changes. Spleen: Normal in size without focal abnormality. Adrenals/Urinary Tract: The adrenal glands are unremarkable. The kidneys, visualized ureters, and urinary bladder appear unremarkable. Stomach/Bowel: Postsurgical changes of bowel with anastomotic suture. There is mild diffuse thickening of the small bowel wall in the right hemiabdomen involving the mid and distal ileum as well as terminal ileum. There is mild dilatation of these loops of small bowel with fluid content. The are distal ileum measures up to 4.7 cm in diameter. Findings concerning for recurrence of Crohn's disease with a degree of obstruction at the level of the ileocecal valve. The colon is unremarkable. The appendix is not visualized and may be surgically absent. Vascular/Lymphatic: The abdominal aorta and IVC unremarkable. No portal venous gas. There is no adenopathy. Reproductive: The prostate and seminal vesicles are grossly unremarkable. No pelvic mass. Other: None Musculoskeletal: Midline vertical anterior pelvic wall incisional scar. No acute osseous pathology. IMPRESSION: Findings concerning for recurrence of Crohn's disease with a degree of obstruction at the  level of the ileocecal valve. Electronically Signed   By: Elgie Collard M.D.   On: 04/15/2021 03:22    Procedures Procedures   Medications Ordered in ED Medications  fentaNYL (SUBLIMAZE) injection 50 mcg (has no administration in time range)  ondansetron (ZOFRAN) injection 4 mg (has no administration in time range)  lidocaine (XYLOCAINE) 2 % jelly 1 application (has no administration in time range)  fentaNYL (SUBLIMAZE) injection 50 mcg (50 mcg Intravenous Given 04/14/21 2346)  ondansetron (ZOFRAN) injection 4 mg (4 mg Intravenous Given 04/14/21 2346)  lactated ringers bolus 1,000 mL (0 mLs Intravenous Stopped 04/15/21 0304)  iohexol (OMNIPAQUE) 300 MG/ML solution 100 mL (100 mLs Intravenous Contrast Given 04/15/21 8841)    ED Course  I have reviewed the triage vital signs and the nursing notes.  Pertinent labs & imaging results that were available during my care of the patient were reviewed by me and considered in my medical decision making (see chart for details).    MDM Rules/Calculators/A&P                           This patient presents to the ED for concern of abdominal pain, this involves an extensive number of treatment options, and is a complaint that carries with it a high risk of complications and morbidity.  The differential  diagnosis includes bowel perforation, bowel obstruction, appendicitis, kidney stone, UTI, pancreatitis, cholelithiasis   Lab Tests:  I Ordered, reviewed, and interpreted labs, which included electrolytes, complete blood count, lipase, urinalysis  Medicines ordered:  I ordered medication fentanyl for pain  Imaging Studies ordered:  I ordered imaging studies which included CT abdomen pelvis  I independently visualized and interpreted imaging which showed bowel obstruction  Additional history obtained:  Additional history obtained from spouse   Consultations Obtained:  I consulted [facility]ad hospitalist dr Thomes Dinning  and discussed lab and  imaging findings  Reevaluation:  After the interventions stated above, I reevaluated the patient and found pt is stable  Patient with previous history of Crohn's disease presents with abdominal pain and vomiting.  CT imaging confirms small bowel obstruction. No indication for emergent operative management.  Will place NG tube and admit to the hospitalist.  Gastroenterology and surgery can be consulted later in the morning.  Discussed with Dr. Thomes Dinning for admission   Final Clinical Impression(s) / ED Diagnoses Final diagnoses:  Crohn's disease of small intestine with intestinal obstruction [facility])    Rx / DC Orders ED Discharge Orders     None        Zadie Rhine, MD 04/15/21 0400

## 2021-04-16 DIAGNOSIS — K56609 Unspecified intestinal obstruction, unspecified as to partial versus complete obstruction: Secondary | ICD-10-CM | POA: Diagnosis not present

## 2021-04-16 LAB — COMPREHENSIVE METABOLIC PANEL
ALT: 27 U/L (ref 0–44)
AST: 19 U/L (ref 15–41)
Albumin: 3.4 g/dL — ABNORMAL LOW (ref 3.5–5.0)
Alkaline Phosphatase: 78 U/L (ref 38–126)
Anion gap: 9 (ref 5–15)
BUN: 14 mg/dL (ref 6–20)
CO2: 24 mmol/L (ref 22–32)
Calcium: 8.6 mg/dL — ABNORMAL LOW (ref 8.9–10.3)
Chloride: 102 mmol/L (ref 98–111)
Creatinine, Ser: 1.16 mg/dL (ref 0.61–1.24)
GFR, Estimated: >60 mL/min (ref >60)
Glucose, Bld: 149 mg/dL — ABNORMAL HIGH (ref 70–99)
Potassium: 4.2 mmol/L (ref 3.5–5.1)
Sodium: 135 mmol/L (ref 135–145)
Total Bilirubin: 0.5 mg/dL (ref 0.3–1.2)
Total Protein: 6.3 g/dL — ABNORMAL LOW (ref 6.5–8.1)

## 2021-04-16 LAB — CBC
HCT: 41.2 % (ref 39.0–52.0)
Hemoglobin: 14.6 g/dL (ref 13.0–17.0)
MCH: 32.4 pg (ref 26.0–34.0)
MCHC: 35.4 g/dL (ref 30.0–36.0)
MCV: 91.6 fL (ref 80.0–100.0)
Platelets: 241 10*3/uL (ref 150–400)
RBC: 4.5 MIL/uL (ref 4.22–5.81)
RDW: 12.7 % (ref 11.5–15.5)
WBC: 9.6 10*3/uL (ref 4.0–10.5)
nRBC: 0 % (ref 0.0–0.2)

## 2021-04-16 LAB — MAGNESIUM: Magnesium: 1.8 mg/dL (ref 1.7–2.4)

## 2021-04-16 MED ORDER — CIPROFLOXACIN HCL 500 MG PO TABS: 500.0000 mg | ORAL_TABLET | Freq: Two times a day (BID) | ORAL | 0 refills | Status: DC

## 2021-04-16 MED ORDER — CIPROFLOXACIN HCL 500 MG PO TABS: 500.0000 mg | ORAL_TABLET | Freq: Two times a day (BID) | ORAL | 0 refills | Status: AC

## 2021-04-16 MED ORDER — METRONIDAZOLE 500 MG PO TABS: 500.0000 mg | ORAL_TABLET | Freq: Three times a day (TID) | ORAL | 0 refills | Status: AC

## 2021-04-16 MED ORDER — METRONIDAZOLE 500 MG PO TABS: 500.0000 mg | ORAL_TABLET | Freq: Three times a day (TID) | ORAL | 0 refills | Status: DC

## 2021-04-16 MED ORDER — PREDNISONE 10 MG PO TABS: ORAL_TABLET | ORAL | 0 refills | Status: AC

## 2021-04-16 NOTE — Discharge Summary (Signed)
Physician Discharge Summary  Stephen Atkinson ZOX:[id number] DOB: 20-May-1976 DOA: 04/14/2021  PCP: Stephen Coss, MD  Admit date: 04/14/2021  Discharge date: 04/16/2021  Admitted From:Home  Disposition:  Home  Recommendations for Outpatient Follow-up:  Follow up with PCP in 1-2 weeks Follow-up to be scheduled with GI in 4 weeks Continue on prednisone taper as prescribed Continue Cipro and Flagyl as prescribed for 9 more days  Home Health: None  Equipment/Devices: None  Discharge Condition:Stable  CODE STATUS: Full  Diet recommendation: Heart Healthy  Brief/Interim Summary:  Stephen Atkinson is a 45 y.o. male with medical history significant for prior Crohn's disease s/p resection of terminal ileum (2006) who presents to the emergency department due to abdominal pain which started on the day prior to admission.  CT scan demonstrated findings concerning for recurrence of Crohn's disease with a degree of obstruction at the level of the ileocecal valve.  He was briefly placed on NG tube with low intermittent suction as well as IV medications.  NG tube was removed fairly quickly and his diet was advanced and he appears to be tolerating this well.  He is now passing flatus with bowel movements.  He was started on IV Solu-Medrol and ciprofloxacin and Flagyl for treatment.  He has shown rapid improvement and has been seen by GI who will follow up with him in the outpatient setting in 4 weeks.  He will be discharged on prednisone and antibiotics as noted above.  Discharge Diagnoses:  Principal Problem:   Small bowel obstruction (HCC) Active Problems:   Abdominal pain   Nausea & vomiting   Leukocytosis   Hyperglycemia  Principal discharge diagnosis: Partial SBO in the setting of Crohn's flare.  Discharge Instructions  Discharge Instructions     Diet - low sodium heart healthy   Complete by: As directed    Increase activity slowly   Complete by: As directed       Allergies as  of 04/16/2021       Reactions   Morphine And Related    Blood pressure drop        Medication List     TAKE these medications    ciprofloxacin 500 MG tablet Commonly known as: Cipro Take 1 tablet (500 mg total) by mouth 2 (two) times daily for 9 days.   FISH OIL PO Take 1 tablet by mouth daily.   ibuprofen 200 MG tablet Commonly known as: ADVIL Take 800 mg by mouth every 6 (six) hours as needed.   L-Lysine 500 MG Tabs Take 1 tablet by mouth daily.   metroNIDAZOLE 500 MG tablet Commonly known as: FLAGYL Take 1 tablet (500 mg total) by mouth 3 (three) times daily for 9 days.   MULTIVITAMINS PO Take 1 tablet by mouth daily.   omeprazole 20 MG capsule Commonly known as: PRILOSEC Take 20 mg by mouth daily.   predniSONE 10 MG tablet Commonly known as: DELTASONE Take 4 tablets (40 mg total) by mouth daily for 6 days, THEN 3.5 tablets (35 mg total) daily for 7 days, THEN 3 tablets (30 mg total) daily for 7 days, THEN 2.5 tablets (25 mg total) daily for 7 days, THEN 2 tablets (20 mg total) daily for 7 days, THEN 1.5 tablets (15 mg total) daily for 7 days, THEN 1 tablet (10 mg total) daily for 7 days, THEN 0.5 tablets (5 mg total) daily for 7 days. Start taking on: April 16, 2021        Follow-up Information  Hungar[place], Carlena Bjornstad, MD. Schedule an appointment as soon as possible for a visit in 1 week(s).   Specialty: Family Medicine Contact information: [facility] [address] [place] [address] [phone]         [facility]. Schedule an appointment as soon as possible for a visit in 2 week(s).   Contact information: [address] [place] [address] [phone]               Allergies  Allergen Reactions   Morphine And Related     Blood pressure drop    Consultations: GI   Procedures/Studies: CT ABDOMEN PELVIS W CONTRAST  Result Date: 04/15/2021 CLINICAL  DATA:  Abdominal pain.  History of Crohn's. EXAM: CT ABDOMEN AND PELVIS WITH CONTRAST TECHNIQUE: Multidetector CT imaging of the abdomen and pelvis was performed using the standard protocol following bolus administration of intravenous contrast. CONTRAST:  OMNIPAQUE IOHEXOL 300 MG/ML  SOLN COMPARISON:  None. FINDINGS: Lower chest: Minimal bibasilar dependent atelectasis. The visualized lung bases are otherwise clear. No intra-abdominal free air or free fluid. Hepatobiliary: No focal liver abnormality is seen. No gallstones, gallbladder wall thickening, or biliary dilatation. Pancreas: Unremarkable. No pancreatic ductal dilatation or surrounding inflammatory changes. Spleen: Normal in size without focal abnormality. Adrenals/Urinary Tract: The adrenal glands are unremarkable. The kidneys, visualized ureters, and urinary bladder appear unremarkable. Stomach/Bowel: Postsurgical changes of bowel with anastomotic suture. There is mild diffuse thickening of the small bowel wall in the right hemiabdomen involving the mid and distal ileum as well as terminal ileum. There is mild dilatation of these loops of small bowel with fluid content. The are distal ileum measures up to 4.7 cm in diameter. Findings concerning for recurrence of Crohn's disease with a degree of obstruction at the level of the ileocecal valve. The colon is unremarkable. The appendix is not visualized and may be surgically absent. Vascular/Lymphatic: The abdominal aorta and IVC unremarkable. No portal venous gas. There is no adenopathy. Reproductive: The prostate and seminal vesicles are grossly unremarkable. No pelvic mass. Other: None Musculoskeletal: Midline vertical anterior pelvic wall incisional scar. No acute osseous pathology. IMPRESSION: Findings concerning for recurrence of Crohn's disease with a degree of obstruction at the level of the ileocecal valve. Electronically Signed   By: Elgie Collard M.D.   On: 04/15/2021 03:22   DG Chest  Portable 1 View  Result Date: 04/15/2021 CLINICAL DATA:  45 year old male NG tube placement. EXAM: PORTABLE CHEST 1 VIEW COMPARISON:  CT Abdomen and Pelvis 0256 hours today. FINDINGS: Portable AP upright view at 0450 hours. Enteric tube placed into the stomach, loops at the level of the gastric body. Low lung volumes. Mild dependent atelectasis demonstrated on the earlier CT. But Allowing for portable technique the lungs are clear. Normal cardiac size and mediastinal contours. Visualized tracheal air column is within normal limits. No pneumothorax. Negative visible bowel gas pattern. No acute osseous abnormality identified. IMPRESSION: 1. Satisfactory enteric tube placement into the stomach. 2. Low lung volumes with mild atelectasis. Electronically Signed   By: Odessa Fleming M.D.   On: 04/15/2021 05:37     Discharge Exam: Vitals:   04/15/21 2035 04/16/21 0454  BP: (!) 144/87 134/80  Pulse: 96 98  Resp: 18 18  Temp: 98.1 F (36.7 C) 98.6 F (37 C)  SpO2: 93% 93%   Vitals:   04/15/21 0800 04/15/21 1557 04/15/21 2035 04/16/21 0454  BP: 120/80 113/72 (!) 144/87 134/80  Pulse: 76 84  96 98  Resp: 18 17 18 18   Temp: (!) 97.4 F (36.3 C) (!) 97 F (36.1 C) 98.1 F (36.7 C) 98.6 F (37 C)  TempSrc: Oral  Oral Oral  SpO2: 95% 94% 93% 93%  Weight:      Height:        General: Pt is alert, awake, not in acute distress Cardiovascular: RRR, S1/S2 +, no rubs, no gallops Respiratory: CTA bilaterally, no wheezing, no rhonchi Abdominal: Soft, NT, ND, bowel sounds + Extremities: no edema, no cyanosis    The results of significant diagnostics from this hospitalization (including imaging, microbiology, ancillary and laboratory) are listed below for reference.     Microbiology: Recent Results (from the past 240 hour(s))  Resp Panel by RT-PCR (Flu A&B, Covid) Nasopharyngeal Swab     Status: None   Collection Time: 04/15/21  4:00 AM   Specimen: Nasopharyngeal Swab; Nasopharyngeal(NP) swabs in vial  transport medium  Result Value Ref Range Status   SARS Coronavirus 2 by RT PCR NEGATIVE NEGATIVE Final    Comment: (NOTE) SARS-CoV-2 target nucleic acids are NOT DETECTED.  The SARS-CoV-2 RNA is generally detectable in upper respiratory specimens during the acute phase of infection. The lowest concentration of SARS-CoV-2 viral copies this assay can detect is 138 copies/mL. A negative result does not preclude SARS-Cov-2 infection and should not be used as the sole basis for treatment or other patient management decisions. A negative result may occur with  improper specimen collection/handling, submission of specimen other than nasopharyngeal swab, presence of viral mutation(s) within the areas targeted by this assay, and inadequate number of viral copies(<138 copies/mL). A negative result must be combined with clinical observations, patient history, and epidemiological information. The expected result is Negative.  Fact Sheet for Patients:  04/17/21  Fact Sheet for Healthcare Providers:  BloggerCourse.com  This test is no t yet approved or cleared by the [place] FDA and  has been authorized for detection and/or diagnosis of SARS-CoV-2 by FDA under an Emergency Use Authorization (EUA). This EUA will remain  in effect (meaning this test can be used) for the duration of the COVID-19 declaration under Section 564(b)(1) of the Act, 21 U.S.C.section 360bbb-3(b)(1), unless the authorization is terminated  or revoked sooner.       Influenza A by PCR NEGATIVE NEGATIVE Final   Influenza B by PCR NEGATIVE NEGATIVE Final    Comment: (NOTE) The Xpert Xpress SARS-CoV-2/FLU/RSV plus assay is intended as an aid in the diagnosis of influenza from Nasopharyngeal swab specimens and should not be used as a sole basis for treatment. Nasal washings and aspirates are unacceptable for Xpert Xpress SARS-CoV-2/FLU/RSV testing.  Fact  Sheet for Patients: [place]  Fact Sheet for Healthcare Providers: BloggerCourse.com  This test is not yet approved or cleared by the [place] FDA and has been authorized for detection and/or diagnosis of SARS-CoV-2 by FDA under an Emergency Use Authorization (EUA). This EUA will remain in effect (meaning this test can be used) for the duration of the COVID-19 declaration under Section 564(b)(1) of the Act, 21 U.S.C. section 360bbb-3(b)(1), unless the authorization is terminated or revoked.  Performed at [facility], [address], [place], [place]      Labs: BNP (last 3 results) No results for input(s): BNP in the last 8760 hours. Basic Metabolic Panel: Recent Labs  Lab 04/14/21 2330 04/15/21 0524 04/16/21 0359  NA 137  --  135  K 3.5  --  4.2  CL 102  --  102  CO2 25  --  24  GLUCOSE 134*  --  149*  BUN 17  --  14  CREATININE 1.20  --  1.16  CALCIUM 9.1  --  8.6*  MG  --  1.7 1.8  PHOS  --  3.9  --    Liver Function Tests: Recent Labs  Lab 04/14/21 2330 04/16/21 0359  AST 30 19  ALT 46* 27  ALKPHOS 110 78  BILITOT 1.2 0.5  PROT 7.7 6.3*  ALBUMIN 4.3 3.4*   Recent Labs  Lab 04/14/21 2330  LIPASE 29   No results for input(s): AMMONIA in the last 168 hours. CBC: Recent Labs  Lab 04/14/21 2330 04/16/21 0359  WBC 15.2* 9.6  NEUTROABS 10.7*  --   HGB 16.7 14.6  HCT 47.7 41.2  MCV 89.5 91.6  PLT 291 241   Cardiac Enzymes: No results for input(s): CKTOTAL, CKMB, CKMBINDEX, TROPONINI in the last 168 hours. BNP: Invalid input(s): POCBNP CBG: No results for input(s): GLUCAP in the last 168 hours. D-Dimer No results for input(s): DDIMER in the last 72 hours. Hgb A1c No results for input(s): HGBA1C in the last 72 hours. Lipid Profile No results for input(s): CHOL, HDL, LDLCALC, TRIG, CHOLHDL, LDLDIRECT in the last 72 hours. Thyroid function studies No results for input(s):  TSH, T4TOTAL, T3FREE, THYROIDAB in the last 72 hours.  Invalid input(s): FREET3 Anemia work up No results for input(s): VITAMINB12, FOLATE, FERRITIN, TIBC, IRON, RETICCTPCT in the last 72 hours. Urinalysis    Component Value Date/Time   COLORURINE YELLOW 04/15/2021 0326   APPEARANCEUR CLEAR 04/15/2021 0326   LABSPEC 1.032 (H) 04/15/2021 0326   PHURINE 5.0 04/15/2021 0326   GLUCOSEU NEGATIVE 04/15/2021 0326   HGBUR NEGATIVE 04/15/2021 0326   BILIRUBINUR NEGATIVE 04/15/2021 0326   KETONESUR NEGATIVE 04/15/2021 0326   PROTEINUR NEGATIVE 04/15/2021 0326   NITRITE NEGATIVE 04/15/2021 0326   LEUKOCYTESUR NEGATIVE 04/15/2021 0326   Sepsis Labs Invalid input(s): PROCALCITONIN,  WBC,  LACTICIDVEN Microbiology Recent Results (from the past 240 hour(s))  Resp Panel by RT-PCR (Flu A&B, Covid) Nasopharyngeal Swab     Status: None   Collection Time: 04/15/21  4:00 AM   Specimen: Nasopharyngeal Swab; Nasopharyngeal(NP) swabs in vial transport medium  Result Value Ref Range Status   SARS Coronavirus 2 by RT PCR NEGATIVE NEGATIVE Final    Comment: (NOTE) SARS-CoV-2 target nucleic acids are NOT DETECTED.  The SARS-CoV-2 RNA is generally detectable in upper respiratory specimens during the acute phase of infection. The lowest concentration of SARS-CoV-2 viral copies this assay can detect is 138 copies/mL. A negative result does not preclude SARS-Cov-2 infection and should not be used as the sole basis for treatment or other patient management decisions. A negative result may occur with  improper specimen collection/handling, submission of specimen other than nasopharyngeal swab, presence of viral mutation(s) within the areas targeted by this assay, and inadequate number of viral copies(<138 copies/mL). A negative result must be combined with clinical observations, patient history, and epidemiological information. The expected result is Negative.  Fact Sheet for Patients:   BloggerCourse.com  Fact Sheet for Healthcare Providers:  [employer]it  This test is no t yet approved or cleared by the [place] FDA and  has been authorized for detection and/or diagnosis of SARS-CoV-2 by FDA under an Emergency Use Authorization (EUA). This EUA will remain  in effect (meaning this test can be used) for the duration of the COVID-19 declaration under Section 564(b)(1) of the Act,  21 U.S.C.section 360bbb-3(b)(1), unless the authorization is terminated  or revoked sooner.       Influenza A by PCR NEGATIVE NEGATIVE Final   Influenza B by PCR NEGATIVE NEGATIVE Final    Comment: (NOTE) The Xpert Xpress SARS-CoV-2/FLU/RSV plus assay is intended as an aid in the diagnosis of influenza from Nasopharyngeal swab specimens and should not be used as a sole basis for treatment. Nasal washings and aspirates are unacceptable for Xpert Xpress SARS-CoV-2/FLU/RSV testing.  Fact Sheet for Patients: BloggerCourse.com  Fact Sheet for Healthcare Providers: [employer]ker.it  This test is not yet approved or cleared by the [place] FDA and has been authorized for detection and/or diagnosis of SARS-CoV-2 by FDA under an Emergency Use Authorization (EUA). This EUA will remain in effect (meaning this test can be used) for the duration of the COVID-19 declaration under Section 564(b)(1) of the Act, 21 U.S.C. section 360bbb-3(b)(1), unless the authorization is terminated or revoked.  Performed at [facility], [address], [place], [place] [address]      Time coordinating discharge: 35 minutes  SIGNED:   Erick Blinks, DO [facility] 04/16/2021, 11:14 AM  If 7PM-7AM, please contact night-coverage [email or web address]

## 2021-04-16 NOTE — Progress Notes (Signed)
Subjective:  Patient feels much better.  He has not had any nausea or vomiting.  He is passing liquid stools but volume is small and there coming more thick.  No rectal bleeding.  He feels bloated but bloating is much less than yesterday.  His appetite is good.  Current Medications:  Current Facility-Administered Medications:    calcium carbonate (TUMS - dosed in mg elemental calcium) chewable tablet 200 mg of elemental calcium, 1 tablet, Oral, BID PRN, Adefeso, Oladapo, DO, 200 mg of elemental calcium at 04/15/21 2206   ciprofloxacin (CIPRO) IVPB 400 mg, 400 mg, Intravenous, Q12H, Shah, Pratik D, DO, Stopped at 04/16/21 0136   enoxaparin (LOVENOX) injection 40 mg, 40 mg, Subcutaneous, Q24H, Adefeso, Oladapo, DO, 40 mg at 04/16/21 0448   fentaNYL (SUBLIMAZE) injection 50 mcg, 50 mcg, Intravenous, Q2H PRN, Adefeso, Oladapo, DO   lactated ringers infusion, , Intravenous, Continuous, Shah, Pratik D, DO, Stopped at 04/16/21 0947   methylPREDNISolone sodium succinate (SOLU-MEDROL) 40 mg/mL injection 40 mg, 40 mg, Intravenous, Q12H, Shah, Pratik D, DO, 40 mg at 04/16/21 0030   metroNIDAZOLE (FLAGYL) IVPB 500 mg, 500 mg, Intravenous, Q8H, Shah, Pratik D, DO, Stopped at 04/16/21 0543   ondansetron (ZOFRAN) injection 4 mg, 4 mg, Intravenous, Q6H PRN, Adefeso, Oladapo, DO, 4 mg at 04/15/21 0403   Objective: Blood pressure 134/80, pulse 98, temperature 98.6 F (37 C), temperature source Oral, resp. rate 18, height 5' 8"  (1.727 m), weight 103.4 kg, SpO2 93 %. Patient is alert and in no acute distress. Abdomen is less distended than yesterday.  He has low midline scar.  Bowel sounds are normal.  On palpation abdomen is soft without tenderness organomegaly or masses.  Labs/studies Results:   CBC Latest Ref Rng & Units 04/16/2021 04/14/2021  WBC 4.0 - 10.5 K/uL 9.6 15.2(H)  Hemoglobin 13.0 - 17.0 g/dL 14.6 16.7  Hematocrit 39.0 - 52.0 % 41.2 47.7  Platelets 150 - 400 K/uL 241 291    CMP Latest Ref  Rng & Units 04/16/2021 04/14/2021  Glucose 70 - 99 mg/dL 149(H) 134(H)  BUN 6 - 20 mg/dL 14 17  Creatinine 0.61 - 1.24 mg/dL 1.16 1.20  Sodium 135 - 145 mmol/L 135 137  Potassium 3.5 - 5.1 mmol/L 4.2 3.5  Chloride 98 - 111 mmol/L 102 102  CO2 22 - 32 mmol/L 24 25  Calcium 8.9 - 10.3 mg/dL 8.6(L) 9.1  Total Protein 6.5 - 8.1 g/dL 6.3(L) 7.7  Total Bilirubin 0.3 - 1.2 mg/dL 0.5 1.2  Alkaline Phos 38 - 126 U/L 78 110  AST 15 - 41 U/L 19 30  ALT 0 - 44 U/L 27 46(H)    Hepatic Function Latest Ref Rng & Units 04/16/2021 04/14/2021  Total Protein 6.5 - 8.1 g/dL 6.3(L) 7.7  Albumin 3.5 - 5.0 g/dL 3.4(L) 4.3  AST 15 - 41 U/L 19 30  ALT 0 - 44 U/L 27 46(H)  Alk Phosphatase 38 - 126 U/L 78 110  Total Bilirubin 0.3 - 1.2 mg/dL 0.5 1.2    GI pathogen panel pending Fecal calprotectin pending  Assessment:  #1.  Symptoms most likely secondary to relapse of Crohn's disease.  Infection less likely but is being ruled out.  He is responded to medical therapy.  As discussed with Dr. Manuella Ghazi he can go home after lunch today.  #2.  AST and ALT are normal.  ALT was mildly elevated on admission.   Recommendations  Would recommend patient continue full liquids today and tomorrow and then  advance diet to heart healthy. He can be discharged on 40 mg of prednisone every morning and he will drop dose by 5 mg every week. Continue Cipro and metronidazole for another 10 days. I reminded patient not to take ibuprofen or other NSAIDs and he can use Tylenol for musculoskeletal pain on as-needed basis. I will reach out to patient regarding pending studies and arrange for follow-up visit in 4 weeks.

## 2021-04-16 NOTE — Plan of Care (Signed)

## 2021-04-18 LAB — GASTROINTESTINAL PANEL BY PCR, STOOL (REPLACES STOOL CULTURE)
Adenovirus F40/41: NOT DETECTED
Astrovirus: NOT DETECTED
Campylobacter species: NOT DETECTED
Cryptosporidium: NOT DETECTED
Cyclospora cayetanensis: NOT DETECTED
Entamoeba histolytica: NOT DETECTED
Enteroaggregative E coli (EAEC): NOT DETECTED
Enteropathogenic E coli (EPEC): NOT DETECTED
Enterotoxigenic E coli (ETEC): NOT DETECTED
Giardia lamblia: NOT DETECTED
Norovirus GI/GII: NOT DETECTED
Plesimonas shigelloides: DETECTED — AB
Rotavirus A: NOT DETECTED
Salmonella species: NOT DETECTED
Sapovirus (I, II, IV, and V): NOT DETECTED
Shiga like toxin producing E coli (STEC): NOT DETECTED
Shigella/Enteroinvasive E coli (EIEC): NOT DETECTED
Vibrio cholerae: NOT DETECTED
Vibrio species: NOT DETECTED
Yersinia enterocolitica: NOT DETECTED

## 2021-04-19 LAB — CALPROTECTIN, FECAL: Calprotectin, Fecal: 427 ug/g — ABNORMAL HIGH (ref 0–120)

## 2021-05-17 ENCOUNTER — Encounter (INDEPENDENT_AMBULATORY_CARE_PROVIDER_SITE_OTHER): Payer: Self-pay | Admitting: Gastroenterology

## 2021-05-17 ENCOUNTER — Ambulatory Visit (INDEPENDENT_AMBULATORY_CARE_PROVIDER_SITE_OTHER): Payer: BC Managed Care – PPO | Admitting: Gastroenterology

## 2021-05-17 ENCOUNTER — Ambulatory Visit (INDEPENDENT_AMBULATORY_CARE_PROVIDER_SITE_OTHER): Payer: PRIVATE HEALTH INSURANCE | Admitting: Internal Medicine

## 2021-05-17 ENCOUNTER — Other Ambulatory Visit: Payer: Self-pay

## 2021-05-17 VITALS — BP 131/92 | HR 84 | Temp 97.5°F | Ht 68.0 in | Wt 237.9 lb

## 2021-05-17 DIAGNOSIS — K529 Noninfective gastroenteritis and colitis, unspecified: Secondary | ICD-10-CM

## 2021-05-17 DIAGNOSIS — K56609 Unspecified intestinal obstruction, unspecified as to partial versus complete obstruction: Secondary | ICD-10-CM

## 2021-05-17 DIAGNOSIS — Z8719 Personal history of other diseases of the digestive system: Secondary | ICD-10-CM | POA: Diagnosis not present

## 2021-05-17 DIAGNOSIS — A049 Bacterial intestinal infection, unspecified: Secondary | ICD-10-CM | POA: Diagnosis not present

## 2021-05-17 NOTE — Patient Instructions (Addendum)
Finish prednisone taper course Schedule colonoscopy in 8 weeks If recurrent symptoms prior to the day of your colonoscopy, please call us to schedule an earlier date Will consider MR enterography if colonoscopy is normal

## 2021-05-17 NOTE — Progress Notes (Signed)
Maylon Peppers, M.D. Gastroenterology & Hepatology [facility] [address] [place], [place] [address]  Primary Care Physician: Yvone Neu, MD [place] [address]  I will communicate my assessment and recommendations to the referring MD via EMR.  Problems: History of Crohn's ileitis  History of Present Illness: Stephen Atkinson is a 45 y.o. male with past medical history of Crohn's ileitis, who presents for follow up after recent hospitalization for Crohn's flare.  The patient was last seen on 04/14/2021 after he was admitted to the hospital with acute onset of abdominal pain in the lower area as well as nausea and vomiting.  At that time he underwent a CT scan of the abdomen and pelvis with IV contrast which showed presence of thickening of the small bowel in the right hemiabdomen involving the mid and distal ileum with presence of dilation of the small bowel loops which were concerning for recurrence of Crohn's disease and partial obstruction.  Gastroenterology was consulted at that time who considered important ruling out an infection.  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  He was started on ciprofloxacin and metronidazole, but he was also started on methylprednisolone IV for management of Crohn's disease.  Notably his fecal calprotectin came back elevated at a value of 427.  Patient had significant improvement with medical treatment and was able to tolerate diet.  He was discharged on prednisone taper and a 7-day course of antibiotic.  Patient reports that he was diagnosed when he was 45 years old in 2006. He reported having new onset abdominal pain at that time when he was in the [employer]. He was initially diagnosed with appendicitis but the was found to have to perforated appendicitis and necrosis consistent with CD.  He states he had 6 inches of his  SB removed at that time. This was performed at [facility], [place] but no reports are available.  Patient reports that he was put on azathioprine after he recovered from his surgery on the same year , but after he was found to have a normal colonoscopy in 2012 (last colonoscopy he recalls having) this medication was stopped. He recalls having a second flare in 2007 while he was "waiting until the azathioprine kick in in his body" for which he required an NGT placement as he had a partial SBO but did not require surgery.  Notably , since his last colonoscopy he has not required any more prednisone or had any more flares.  Patient reports that he was surprised as this time he had normalization of his bowel movement consistency 2 days after being discharged which was different from the two previous episodes of Crohn's disease flare.  Patient reports that he finished his antibiotic course and he has been taking prednisone taper. He was discharged on prednisone 40 mg every day decreasing by 5  mg every 7 days, but he jumped down to 25 mg 3 days after being discharged as he was feeling better. He is currently on 10 mg qday.  He is currently having a BM every 1-2 days.  Denies diarrhea.  The patient currently denies having any nausea, vomiting, fever, chills, hematochezia, melena, hematemesis, abdominal distention, abdominal pain, diarrhea, jaundice, pruritus or weight loss.  No EIM.  Last EGD: never Last Colonoscopy: as above, no report is available  FHx: neg for any gastrointestinal/liver disease, grandfather leukemia Social: neg smoking, alcohol or illicit drug use Surgical: pilonidal cyst  and bowel surgery described above  Past Medical History: Past Medical History:  Diagnosis Date   Crohn's disease ([place])     Past Surgical History: Past Surgical History:  Procedure Laterality Date   COLON SURGERY      Family History:History reviewed. No pertinent family history.  Social History: Social  History   Tobacco Use  Smoking Status Never  Smokeless Tobacco Never   Social History   Substance and Sexual Activity  Alcohol Use Not Currently   Social History   Substance and Sexual Activity  Drug Use Not Currently    Allergies: Allergies  Allergen Reactions   Morphine And Related     Blood pressure drop    Medications: Current Outpatient Medications  Medication Sig Dispense Refill   L-Lysine 500 MG TABS Take 1 tablet by mouth daily.     Multiple Vitamin (MULTIVITAMINS PO) Take 1 tablet by mouth daily.     Omega-3 Fatty Acids (FISH OIL PO) Take 1 tablet by mouth daily.     omeprazole (PRILOSEC) 20 MG capsule Take 20 mg by mouth daily.     predniSONE (DELTASONE) 10 MG tablet Take 4 tablets (40 mg total) by mouth daily for 6 days, THEN 3.5 tablets (35 mg total) daily for 7 days, THEN 3 tablets (30 mg total) daily for 7 days, THEN 2.5 tablets (25 mg total) daily for 7 days, THEN 2 tablets (20 mg total) daily for 7 days, THEN 1.5 tablets (15 mg total) daily for 7 days, THEN 1 tablet (10 mg total) daily for 7 days, THEN 0.5 tablets (5 mg total) daily for 7 days. 122 tablet 0   No current facility-administered medications for this visit.    Review of Systems: GENERAL: negative for malaise, night sweats HEENT: No changes in hearing or vision, no nose bleeds or other nasal problems. NECK: Negative for lumps, goiter, pain and significant neck swelling RESPIRATORY: Negative for cough, wheezing CARDIOVASCULAR: Negative for chest pain, leg swelling, palpitations, orthopnea GI: SEE HPI MUSCULOSKELETAL: Negative for joint pain or swelling, back pain, and muscle pain. SKIN: Negative for lesions, rash PSYCH: Negative for sleep disturbance, mood disorder and recent psychosocial stressors. HEMATOLOGY Negative for prolonged bleeding, bruising easily, and swollen nodes. ENDOCRINE: Negative for cold or heat intolerance, polyuria, polydipsia and goiter. NEURO: negative for tremor, gait  imbalance, syncope and seizures. The remainder of the review of systems is noncontributory.   Physical Exam: BP (!) 131/92 (BP Location: Left Arm, Patient Position: Sitting, Cuff Size: Large)   Pulse 84   Temp (!) 97.5 F (36.4 C) (Oral)   Ht 5\' 8"  (1.727 m)   Wt 237 lb 14.4 oz (107.9 kg)   BMI 36.17 kg/m  GENERAL: The patient is AO x3, in no acute distress. HEENT: Head is normocephalic and atraumatic. EOMI are intact. Mouth is well hydrated and without lesions. NECK: Supple. No masses LUNGS: Clear to auscultation. No presence of rhonchi/wheezing/rales. Adequate chest expansion HEART: RRR, normal s1 and s2. ABDOMEN: Soft, nontender, no guarding, no peritoneal signs, and nondistended. BS +. No masses. Has mid abdominal scar. EXTREMITIES: Without any cyanosis, clubbing, rash, lesions or edema. NEUROLOGIC: AOx3, no focal motor deficit. SKIN: no jaundice, no rashes  Imaging/Labs: as above  I personally reviewed and interpreted the available labs, imaging and endoscopic files.  Impression and Plan: Stephen Atkinson is a 45 y.o. male with past medical history of Crohn's ileitis, who presents for follow up after recent hospitalization for Crohn's flare.  The patient had recent hospitalization  due to presence of ileitis with partial small bowel obstruction that improved with conservative treatment and use of a combination of antibiotic and IV steroid.  He was found to have Plesimonas shigelloides in his stool which could account for the inflammation in his small bowel, he has finished his antibiotic course completely.  Clinically his symptoms have completely resolved.  It is unclear if his initial presentation was related to the bacterial infection or due to an exacerbation of his Crohn's disease due to his gastrointestinal infection.  I had a thorough discussion with the patient regarding the need to evaluate again his neo-terminal with colonoscopy.  Ideally this should be performed 8 weeks from  today as the changes of active inflammation will change if he is on steroids when this procedure is performed.  He should finish his prednisone taper completely as well.  I also explained to the patient that if his colonoscopy is normal, we will proceed with an MR enterography to rule out any active inflammation in the rest of his small bowel.  He understood and agreed.  - Finish prednisone taper course - Schedule colonoscopy in 8 weeks - Will consider MR enterography if colonoscopy is normal  All questions were answered.      Dolores Frame, MD Gastroenterology and Hepatology [facility]

## 2021-05-29 ENCOUNTER — Encounter (INDEPENDENT_AMBULATORY_CARE_PROVIDER_SITE_OTHER): Payer: Self-pay

## 2021-05-29 ENCOUNTER — Other Ambulatory Visit (INDEPENDENT_AMBULATORY_CARE_PROVIDER_SITE_OTHER): Payer: Self-pay

## 2021-05-29 ENCOUNTER — Telehealth (INDEPENDENT_AMBULATORY_CARE_PROVIDER_SITE_OTHER): Payer: Self-pay

## 2021-05-29 MED ORDER — PEG 3350-KCL-NA BICARB-NACL 420 G PO SOLR: 4000.0000 mL | ORAL | 0 refills | Status: DC

## 2021-05-29 NOTE — Telephone Encounter (Signed)
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  

## 2021-05-30 ENCOUNTER — Encounter (INDEPENDENT_AMBULATORY_CARE_PROVIDER_SITE_OTHER): Payer: Self-pay

## 2021-07-12 ENCOUNTER — Ambulatory Visit (HOSPITAL_COMMUNITY)
Admission: RE | Admit: 2021-07-12 | Discharge: 2021-07-12 | Disposition: A | Discharge status: Home / Self Care | Payer: BC Managed Care – PPO | Attending: Gastroenterology | Admitting: Gastroenterology

## 2021-07-12 ENCOUNTER — Ambulatory Visit (HOSPITAL_COMMUNITY): Payer: BC Managed Care – PPO | Admitting: Anesthesiology

## 2021-07-12 ENCOUNTER — Encounter (HOSPITAL_COMMUNITY)
Admission: RE | Disposition: A | Discharge status: Home / Self Care | Payer: Self-pay | Source: Home / Self Care | Attending: Gastroenterology

## 2021-07-12 ENCOUNTER — Other Ambulatory Visit: Payer: Self-pay

## 2021-07-12 ENCOUNTER — Encounter (HOSPITAL_COMMUNITY): Payer: Self-pay | Admitting: Gastroenterology

## 2021-07-12 ENCOUNTER — Telehealth (INDEPENDENT_AMBULATORY_CARE_PROVIDER_SITE_OTHER): Payer: Self-pay | Admitting: *Deleted

## 2021-07-12 DIAGNOSIS — K5730 Diverticulosis of large intestine without perforation or abscess without bleeding: Secondary | ICD-10-CM | POA: Diagnosis not present

## 2021-07-12 DIAGNOSIS — K5000 Crohn's disease of small intestine without complications: Secondary | ICD-10-CM

## 2021-07-12 DIAGNOSIS — K624 Stenosis of anus and rectum: Secondary | ICD-10-CM | POA: Insufficient documentation

## 2021-07-12 DIAGNOSIS — K9189 Other postprocedural complications and disorders of digestive system: Secondary | ICD-10-CM | POA: Diagnosis not present

## 2021-07-12 DIAGNOSIS — K635 Polyp of colon: Secondary | ICD-10-CM

## 2021-07-12 HISTORY — PX: BIOPSY: SHX5522

## 2021-07-12 HISTORY — PX: POLYPECTOMY: SHX5525

## 2021-07-12 HISTORY — PX: COLONOSCOPY WITH PROPOFOL: SHX5780

## 2021-07-12 LAB — COMPREHENSIVE METABOLIC PANEL
ALT: 41 U/L (ref 0–44)
AST: 31 U/L (ref 15–41)
Albumin: 3.6 g/dL (ref 3.5–5.0)
Alkaline Phosphatase: 90 U/L (ref 38–126)
Anion gap: 9 (ref 5–15)
BUN: 9 mg/dL (ref 6–20)
CO2: 26 mmol/L (ref 22–32)
Calcium: 9.1 mg/dL (ref 8.9–10.3)
Chloride: 105 mmol/L (ref 98–111)
Creatinine, Ser: 0.95 mg/dL (ref 0.61–1.24)
GFR, Estimated: >60 mL/min (ref >60)
Glucose, Bld: 90 mg/dL (ref 70–99)
Potassium: 3.5 mmol/L (ref 3.5–5.1)
Sodium: 140 mmol/L (ref 135–145)
Total Bilirubin: 0.8 mg/dL (ref 0.3–1.2)
Total Protein: 6.4 g/dL — ABNORMAL LOW (ref 6.5–8.1)

## 2021-07-12 LAB — CBC
HCT: 41.6 % (ref 39.0–52.0)
Hemoglobin: 14.6 g/dL (ref 13.0–17.0)
MCH: 31.1 pg (ref 26.0–34.0)
MCHC: 35.1 g/dL (ref 30.0–36.0)
MCV: 88.5 fL (ref 80.0–100.0)
Platelets: 211 10*3/uL (ref 150–400)
RBC: 4.7 MIL/uL (ref 4.22–5.81)
RDW: 12.6 % (ref 11.5–15.5)
WBC: 5.4 10*3/uL (ref 4.0–10.5)
nRBC: 0 % (ref 0.0–0.2)

## 2021-07-12 LAB — HEPATITIS B SURFACE ANTIGEN: Hepatitis B Surface Ag: NONREACTIVE

## 2021-07-12 LAB — HM COLONOSCOPY

## 2021-07-12 SURGERY — COLONOSCOPY WITH PROPOFOL
Anesthesia: General

## 2021-07-12 MED ORDER — STERILE WATER FOR IRRIGATION IR SOLN
Status: DC | PRN
  Administered 2021-07-12: 250 mL

## 2021-07-12 MED ORDER — LACTATED RINGERS IV SOLN: INTRAVENOUS | Status: DC

## 2021-07-12 MED ORDER — PROPOFOL 500 MG/50ML IV EMUL
INTRAVENOUS | Status: AC
  Filled 2021-07-12: qty 50

## 2021-07-12 MED ORDER — PROPOFOL 500 MG/50ML IV EMUL
INTRAVENOUS | Status: DC | PRN
  Administered 2021-07-12: 180 ug/kg/min via INTRAVENOUS

## 2021-07-12 MED ORDER — PROPOFOL 10 MG/ML IV BOLUS
INTRAVENOUS | Status: DC | PRN
Start: 2021-07-12 — End: 2021-07-12
  Administered 2021-07-12: 80 mg via INTRAVENOUS

## 2021-07-12 NOTE — H&P (Signed)
Stephen Atkinson is an 46 y.o. male.   Chief Complaint: Crohn's disease HPI: Stephen Atkinson is a 46 y.o. male with past medical history of Crohn's ileitis, who presents for evaluation of Crohn's disease.  Denies having any symptoms at the moment.  Has been off medication.  Past Medical History:  Diagnosis Date   Crohn's disease [facility])     Past Surgical History:  Procedure Laterality Date   COLON SURGERY      History reviewed. No pertinent family history. Social History:  reports that he has never smoked. He has never used smokeless tobacco. He reports that he does not currently use alcohol. He reports that he does not currently use drugs.  Allergies:  Allergies  Allergen Reactions   Morphine And Related     Blood pressure drop    Medications Prior to Admission  Medication Sig Dispense Refill   Multiple Vitamin (MULTIVITAMINS PO) Take 1 tablet by mouth daily.     omeprazole (PRILOSEC) 20 MG capsule Take 20 mg by mouth daily as needed.     polyethylene glycol-electrolytes (TRILYTE) 420 g solution Take 4,000 mLs by mouth as directed. 4000 mL 0    No results found for this or any previous visit (from the past 48 hour(s)). No results found.  Review of Systems  Constitutional: Negative.   HENT: Negative.    Eyes: Negative.   Respiratory: Negative.    Cardiovascular: Negative.   Gastrointestinal: Negative.   Endocrine: Negative.   Genitourinary: Negative.   Musculoskeletal: Negative.   Skin: Negative.   Allergic/Immunologic: Negative.   Neurological: Negative.   Hematological: Negative.   Psychiatric/Behavioral: Negative.     Blood pressure 133/79, pulse 77, temperature (!) 97.4 F (36.3 C), temperature source Oral, resp. rate (!) 22, height 5\' 8"  (1.727 m), weight 104.3 kg, SpO2 97 %. Physical Exam  GENERAL: The patient is AO x3, in no acute distress. HEENT: Head is normocephalic and atraumatic. EOMI are intact. Mouth is well hydrated and without lesions. NECK: Supple. No  masses LUNGS: Clear to auscultation. No presence of rhonchi/wheezing/rales. Adequate chest expansion HEART: RRR, normal s1 and s2. ABDOMEN: Soft, nontender, no guarding, no peritoneal signs, and nondistended. BS +. No masses. EXTREMITIES: Without any cyanosis, clubbing, rash, lesions or edema. NEUROLOGIC: AOx3, no focal motor deficit. SKIN: no jaundice, no rashes  Assessment/Plan Stephen Atkinson is a 46 y.o. male with past medical history of Crohn's ileitis, who presents for evaluation of Crohn's disease.  We will proceed with colonoscopy.  Stephen Quale, MD 07/12/2021, 11:13 AM

## 2021-07-12 NOTE — Op Note (Signed)
[facility] Patient Name: Stephen Atkinson Procedure Date: 07/12/2021 11:06 AM MRN: [id number] Date of Birth: 12-15-1975 Attending MD: Katrinka Blazing ,  CSN: [id number] Age: 46 Admit Type: Outpatient Procedure:                Colonoscopy Indications:              Crohn's disease of the small bowel Providers:                Katrinka Blazing, Sheran Fava, Dyann Ruddle Referring MD:              Medicines:                Monitored Anesthesia Care Complications:            No immediate complications. Estimated Blood Loss:     Estimated blood loss: none. Procedure:                Pre-Anesthesia Assessment:                           - Prior to the procedure, a History and Physical                            was performed, and patient medications, allergies                            and sensitivities were reviewed. The patient's                            tolerance of previous anesthesia was reviewed.                           - The risks and benefits of the procedure and the                            sedation options and risks were discussed with the                            patient. All questions were answered and informed                            consent was obtained.                           - ASA Grade Assessment: III - A patient with severe                            systemic disease.                           After obtaining informed consent, the colonoscope                            was passed under direct vision. Throughout the                            procedure, the patient's blood pressure,  pulse, and                            oxygen saturations were monitored continuously. The                            [id number]-[id number]) scope was introduced through                            the anus and advanced to the the ileocolonic                            anastomosis. The colonoscopy was performed without                            difficulty. The patient tolerated  the procedure                            well. The quality of the bowel preparation was                            excellent. Scope In: 11:22:15 AM Scope Out: 11:44:42 AM Scope Withdrawal Time: 0 hours 18 minutes 43 seconds  Total Procedure Duration: 0 hours 22 minutes 27 seconds  Findings:      The perianal and digital rectal examinations were normal.      The scope could not be advanced past the anastomosis as there was       significant stenosis, but the terminal neo-ileum appeared normal upon       visualization from distance. Biopsies were taken with a cold forceps for       histology.      There was evidence of a prior end-to-side ileo-colonic anastomosis in       the cecum. This was non-patent and was characterized by moderate       stenosis. The anastomosis was not traversed.      Two sessile polyps were found in the anastomosis. The polyps were 3 to 5       mm in size. These polyps were removed with a cold snare. Resection and       retrieval were complete.      The colon (entire examined portion) appeared normal.      A single small-mouthed diverticulum was found in the sigmoid colon.      The retroflexed view of the distal rectum and anal verge was normal and       showed no anal or rectal abnormalities. Impression:               - The examined portion of the neo terminal ileum                            was normal. Biopsied.                           - Non-patent end-to-side ileo-colonic anastomosis,                            characterized by moderate stenosis.                           -  Two 3 to 5 mm polyps at the anastomosis, removed                            with a cold snare. Resected and retrieved.                           - The entire examined colon is normal.                           - Diverticulosis in the sigmoid colon.                           - The distal rectum and anal verge are normal on                            retroflexion view. Moderate  Sedation:      Per Anesthesia Care Recommendation:           - Discharge patient to home (ambulatory).                           - Resume previous diet.                           - Follow up in GI clinic in 3-4 weeks                           - Await pathology results.                           - Repeat colonoscopy for surveillance based on                            pathology results.                           - Check CBC, CMP, quantiferon and hepatitis b                            serology Procedure Code(s):        --- Professional ---                           [phone], Colonoscopy, flexible; with removal of                            tumor(s), polyp(s), or other lesion(s) by snare                            technique                           45380, 59, Colonoscopy, flexible; with biopsy,                            single or multiple Diagnosis Code(s):        --- Professional ---  K91.89, Other postprocedural complications and                            disorders of digestive system                           K63.5, Polyp of colon                           K50.00, Crohn's disease of small intestine without                            complications                           K57.30, Diverticulosis of large intestine without                            perforation or abscess without bleeding CPT copyright 2019 American Medical Association. All rights reserved. The codes documented in this report are preliminary and upon coder review may  be revised to meet current compliance requirements. Maylon Peppers, MD Maylon Peppers,  07/12/2021 11:52:48 AM This report has been signed electronically. Number of Addenda: 0

## 2021-07-12 NOTE — Anesthesia Postprocedure Evaluation (Signed)
Anesthesia Post Note  Patient: Stephen Atkinson  Procedure(s) Performed: COLONOSCOPY WITH PROPOFOL BIOPSY POLYPECTOMY  Patient location during evaluation: Endoscopy Anesthesia Type: General Level of consciousness: awake and alert Pain management: pain level controlled Vital Signs Assessment: post-procedure vital signs reviewed and stable Respiratory status: spontaneous breathing, nonlabored ventilation, respiratory function stable and patient connected to nasal cannula oxygen Cardiovascular status: blood pressure returned to baseline and stable Postop Assessment: no apparent nausea or vomiting Anesthetic complications: no   No notable events documented.   Last Vitals:  Vitals:   07/12/21 0840 07/12/21 1149  BP: 133/79 116/63  Pulse: 77 88  Resp: (!) 22 17  Temp: (!) 36.3 C 36.8 C  SpO2: 97% 96%    Last Pain:  Vitals:   07/12/21 1149  TempSrc: Oral  PainSc: 0-No pain                 Glynis Smiles

## 2021-07-12 NOTE — Transfer of Care (Signed)
Immediate Anesthesia Transfer of Care Note  Patient: Stephen Atkinson  Procedure(s) Performed: COLONOSCOPY WITH PROPOFOL BIOPSY POLYPECTOMY  Patient Location: Endoscopy Unit  Anesthesia Type:General  Level of Consciousness: awake and alert   Airway & Oxygen Therapy: Patient Spontanous Breathing  Post-op Assessment: Report given to RN and Post -op Vital signs reviewed and stable  Post vital signs: Reviewed and stable  Last Vitals:  Vitals Value Taken Time  BP    Temp    Pulse    Resp    SpO2      Last Pain:  Vitals:   07/12/21 1118  TempSrc:   PainSc: 0-No pain      Patients Stated Pain Goal: 6 ([id number])  Complications: No notable events documented.

## 2021-07-12 NOTE — Telephone Encounter (Signed)
Patient needs OV 3-4 wks for procedure f/u

## 2021-07-12 NOTE — Discharge Instructions (Addendum)
You are being discharged to home.  Resume your previous diet.  We are waiting for your pathology results.  Your physician has recommended a repeat colonoscopy for surveillance based on pathology results.  Follow up in GI clinic in 3-4 weeks   MESSAGE LEFT ON VOICEMAIL AT DR. CASTANEDA'S OFFICE TO CONTACT PATIENT FOR FOLLOWUP APPOINTMENT IN 3-4WEEKS.

## 2021-07-12 NOTE — Anesthesia Preprocedure Evaluation (Signed)
Anesthesia Evaluation  Patient identified by MRN, date of birth, ID band Patient awake    Reviewed: Allergy & Precautions, NPO status , Patient's Chart, lab work & pertinent test results  Airway   TM Distance: >3 FB Neck ROM: Full    Dental no notable dental hx.    Pulmonary neg pulmonary ROS,    Pulmonary exam normal        Cardiovascular negative cardio ROS Normal cardiovascular exam     Neuro/Psych negative neurological ROS     GI/Hepatic negative GI ROS, Neg liver ROS,   Endo/Other  negative endocrine ROS  Renal/GU negative Renal ROS     Musculoskeletal negative musculoskeletal ROS (+)   Abdominal   Peds  Hematology negative hematology ROS (+)   Anesthesia Other Findings   Reproductive/Obstetrics                             Anesthesia Physical Anesthesia Plan  ASA: 2  Anesthesia Plan: General   Post-op Pain Management:    Induction: Intravenous  PONV Risk Score and Plan: 0 and TIVA  Airway Management Planned: Natural Airway and Nasal Cannula  Additional Equipment:   Intra-op Plan:   Post-operative Plan:   Informed Consent: I have reviewed the patients History and Physical, chart, labs and discussed the procedure including the risks, benefits and alternatives for the proposed anesthesia with the patient or authorized representative who has indicated his/her understanding and acceptance.       Plan Discussed with: CRNA  Anesthesia Plan Comments:         Anesthesia Quick Evaluation

## 2021-07-12 NOTE — Progress Notes (Signed)
Mother Stephen Atkinson notified verbally of delay in starting of case.

## 2021-07-14 ENCOUNTER — Encounter (HOSPITAL_COMMUNITY): Payer: Self-pay | Admitting: Gastroenterology

## 2021-07-14 LAB — SURGICAL PATHOLOGY

## 2021-07-17 ENCOUNTER — Encounter (INDEPENDENT_AMBULATORY_CARE_PROVIDER_SITE_OTHER): Payer: Self-pay

## 2021-07-17 ENCOUNTER — Encounter (INDEPENDENT_AMBULATORY_CARE_PROVIDER_SITE_OTHER): Payer: Self-pay | Admitting: *Deleted

## 2021-07-17 ENCOUNTER — Other Ambulatory Visit (INDEPENDENT_AMBULATORY_CARE_PROVIDER_SITE_OTHER): Payer: Self-pay | Admitting: Gastroenterology

## 2021-07-17 DIAGNOSIS — K50019 Crohn's disease of small intestine with unspecified complications: Secondary | ICD-10-CM

## 2021-07-17 LAB — QUANTIFERON-TB GOLD PLUS

## 2021-08-14 ENCOUNTER — Ambulatory Visit (INDEPENDENT_AMBULATORY_CARE_PROVIDER_SITE_OTHER): Payer: BC Managed Care – PPO | Admitting: Gastroenterology

## 2021-10-09 ENCOUNTER — Ambulatory Visit (INDEPENDENT_AMBULATORY_CARE_PROVIDER_SITE_OTHER): Payer: BC Managed Care – PPO | Admitting: Gastroenterology

## 2021-11-23 ENCOUNTER — Encounter (INDEPENDENT_AMBULATORY_CARE_PROVIDER_SITE_OTHER): Payer: Self-pay | Admitting: Gastroenterology

## 2021-11-23 ENCOUNTER — Ambulatory Visit (INDEPENDENT_AMBULATORY_CARE_PROVIDER_SITE_OTHER): Payer: 59 | Admitting: Gastroenterology

## 2021-11-23 VITALS — BP 131/89 | HR 73 | Temp 98.0°F | Ht 68.0 in | Wt 243.0 lb

## 2021-11-23 DIAGNOSIS — K913 Postprocedural intestinal obstruction, unspecified as to partial versus complete: Secondary | ICD-10-CM

## 2021-11-23 DIAGNOSIS — Z8719 Personal history of other diseases of the digestive system: Secondary | ICD-10-CM

## 2021-11-23 DIAGNOSIS — K9189 Other postprocedural complications and disorders of digestive system: Secondary | ICD-10-CM | POA: Diagnosis not present

## 2021-11-23 NOTE — Patient Instructions (Signed)
Schedule MR enterography Perform blood workup Perform stool workup Please call back if any more obstructive symptoms are present

## 2021-11-23 NOTE — Progress Notes (Signed)
Katrinka Blazing, M.D. Gastroenterology & Hepatology [facility] [address] [place], [place] [address]  Primary Care Physician: Maximiano Coss, MD [facility] [address] [place] [address]  I will communicate my assessment and recommendations to the referring MD via EMR.  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 male with past medical history of Crohn's ileitis, who presents for follow up of Crohn's disease.  The patient was last seen on 05/17/2021. At that time, the patient was advised to finish his prednisone course and he was scheduled for a colonoscopy.A colonoscopy was performed on 07/12/2021 which showed presence of stenosis at the ileocolonic anastomosis, the scope could not be advanced past the area but visualization from the distance appeared to show a normal neoterminal ileum.  Biopsies from the small bowel came back normal but most of the biopsies were consistent with colonic tissue.  He had 2 polyps removed from the anastomosis which were hyperplastic lymphoid aggregates.  Patinet reports tha he has felt well since his last colonoscopy. He reports that certain food causes some abdominal symptoms. Specifically, when he eats a significant amount of fiber, salads spicy food or [place] food that has sauces, he develops bloating and discomfort in his abdomen. When he eats oatmeal in the morning and salad at night, he can also have significant bloating.  He denies any other symptoms like abdominal pain, nausea, vomiting, fever, chills, hematochezia, melena, hematemesis, diarrhea, jaundice, pruritus. Has lost some weight recently on purpose - 6 lb.  Last Colonoscopy: As above  Past Medical History: Past Medical History:  Diagnosis Date   Crohn's disease [facility])     Past Surgical History: Past Surgical History:   Procedure Laterality Date   BIOPSY  07/12/2021   Procedure: BIOPSY;  Surgeon: Dolores Frame, MD;  [facility];  Service: Gastroenterology;;   COLON SURGERY     COLONOSCOPY WITH PROPOFOL N/A 07/12/2021   Procedure: COLONOSCOPY WITH PROPOFOL;  Surgeon: Dolores Frame, MD;  [facility]: [facility];  Service: Gastroenterology;  Laterality: N/A;  9:45   POLYPECTOMY  07/12/2021   Procedure: POLYPECTOMY;  Surgeon: Dolores Frame, MD;  Location: [facility];  Service: Gastroenterology;;    Family History:History reviewed. No pertinent family history.  Social History: Social History   Tobacco Use  Smoking Status Never  Smokeless Tobacco Never   Social History   Substance and Sexual Activity  Alcohol Use Not Currently   Social History   Substance and Sexual Activity  Drug Use Not Currently    Allergies: Allergies  Allergen Reactions   Morphine And Related     Blood pressure drop    Medications: Current Outpatient Medications  Medication Sig Dispense Refill   LYSINE PO Take by mouth daily.     Multiple Vitamin (MULTIVITAMINS PO) Take 1 tablet by mouth daily.     Omega-3 Fatty Acids (OMEGA-3 FISH OIL PO) Take by mouth daily at 6 (six) AM.     omeprazole (PRILOSEC) 20 MG capsule Take 20 mg by mouth daily as needed.     No current facility-administered medications for this visit.    Review of Systems: GENERAL: negative for malaise, night sweats HEENT: No changes in hearing or vision, no nose bleeds or other nasal problems. NECK: Negative for lumps, goiter, pain and significant neck swelling RESPIRATORY: Negative for cough, wheezing CARDIOVASCULAR: Negative for chest pain,  leg swelling, palpitations, orthopnea GI: SEE HPI MUSCULOSKELETAL: Negative for joint pain or swelling, back pain, and muscle pain. SKIN: Negative for lesions, rash PSYCH: Negative for sleep disturbance, mood disorder and recent psychosocial  stressors. HEMATOLOGY Negative for prolonged bleeding, bruising easily, and swollen nodes. ENDOCRINE: Negative for cold or heat intolerance, polyuria, polydipsia and goiter. NEURO: negative for tremor, gait imbalance, syncope and seizures. The remainder of the review of systems is noncontributory.   Physical Exam: BP 131/89 (BP Location: Left Arm, Patient Position: Sitting, Cuff Size: Large)   Pulse 73   Temp 98 F (36.7 C) (Oral)   Ht 5\' 8"  (1.727 m)   Wt 243 lb (110.2 kg)   BMI 36.95 kg/m  GENERAL: The patient is AO x3, in no acute distress. HEENT: Head is normocephalic and atraumatic. EOMI are intact. Mouth is well hydrated and without lesions. NECK: Supple. No masses LUNGS: Clear to auscultation. No presence of rhonchi/wheezing/rales. Adequate chest expansion HEART: RRR, normal s1 and s2. ABDOMEN: Soft, nontender, no guarding, no peritoneal signs, and nondistended. BS +. No masses. EXTREMITIES: Without any cyanosis, clubbing, rash, lesions or edema. NEUROLOGIC: AOx3, no focal motor deficit. SKIN: no jaundice, no rashes  Imaging/Labs: as above  I personally reviewed and interpreted the available labs, imaging and endoscopic files.  Impression and Plan: Stephen Atkinson is a 46 y.o. male with past medical history of Crohn's ileitis, who presents for follow up of Crohn's disease.  The patient underwent a recent recent colonoscopy were he was found to have stenosis at the anastomosis without presence of active inflammation.  It is possible that the inflammation improved after he received his steroid course falsely pointing towards Crohn's disease in remission.  Unfortunately, a total inspection of the small bowel could not be achieved given the the scope could not be traversed past the anastomosis area.  Since he has presented some intermittent episodes of bloating which raises concern for partial anastomotic stricture or recurrent small bowel Crohn's, we will evaluate this area again with  an MR enterography, CRP and fecal calprotectin.  If this test came back abnormal we will discuss biological management.  If all the testing is completely normal, we will discuss the possibility of expectant management versus possible attempt to dilate the stricture with balloon versus surgical resection depending on the severity of his symptoms.  Patient understood and agreed.  - Schedule MR enterography - Check CBC, CMP, CRP, Quantiferon - Fecal calprotectin - Patient to call back if any more obstructive symptoms are present -  RTC 4 months  All questions were answered.      60, MD Gastroenterology and Hepatology [facility]

## 2021-12-08 ENCOUNTER — Ambulatory Visit (HOSPITAL_COMMUNITY)
Admission: RE | Admit: 2021-12-08 | Discharge: 2021-12-08 | Disposition: A | Discharge status: Home / Self Care | Payer: 59 | Source: Ambulatory Visit | Attending: Gastroenterology | Admitting: Gastroenterology

## 2021-12-08 DIAGNOSIS — K913 Postprocedural intestinal obstruction, unspecified as to partial versus complete: Secondary | ICD-10-CM

## 2021-12-08 DIAGNOSIS — Z8719 Personal history of other diseases of the digestive system: Secondary | ICD-10-CM

## 2021-12-08 DIAGNOSIS — K9189 Other postprocedural complications and disorders of digestive system: Secondary | ICD-10-CM | POA: Insufficient documentation

## 2021-12-08 IMAGING — MR MR ^MR ENTEROGRAPHY W/WO
21 series · 48 of 48 positions shown · IV contrast (10 ml Gadavist)
Comparison: CT abdomen pelvis, [DATE]

CLINICAL DATA: Small bowel anastomotic stricture

EXAM:
MR ABDOMEN AND PELVIS WITHOUT AND WITH CONTRAST (MR ENTEROGRAPHY)
TECHNIQUE: Multiplanar, multisequence MRI of the abdomen and pelvis was
performed both before and during bolus administration of intravenous
contrast. Negative oral contrast VoLumen was given.
CONTRAST:  10mL GADAVIST GADOBUTROL 1 MMOL/ML IV SOLN, additional
negative oral enteric contrast

[Series 3: cor haste · coronal · 5.0mm · 1.41mm/px · 3 of 50 slices shown]
[im 1/50]
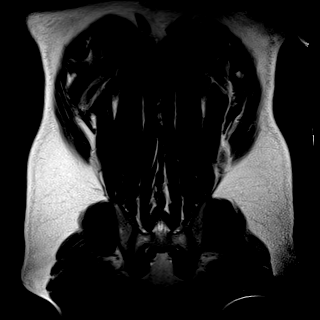
[im 25/50]
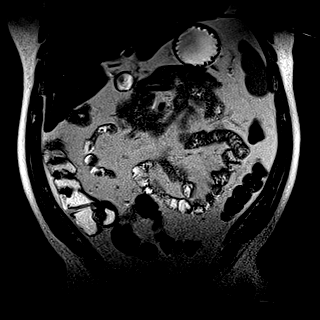
[im 50/50]
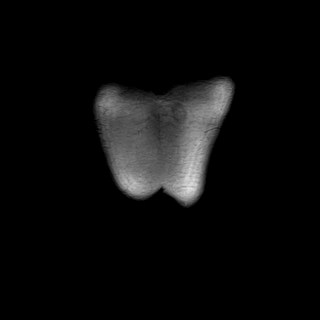

[Series 7: DWI · axial · 6.0mm · 1.49mm/px · z∈[-108,+131]mm · 2 of 35 slices shown (1 of 8)]
[im 1/35]
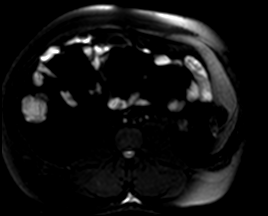
[im 35/35]
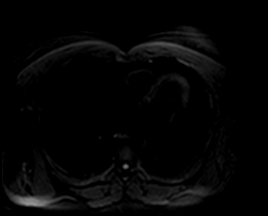

[Series 7: DWI · axial · 6.0mm · 1.49mm/px · z∈[-108,+131]mm · 2 of 35 slices shown (2 of 8)]
[im 1/35]
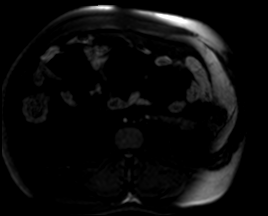
[im 35/35]
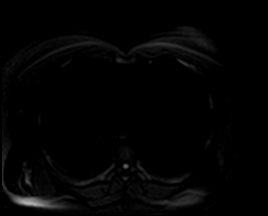

[Series 7: DWI · axial · 6.0mm · 1.49mm/px · 1 of 35 slices shown (3 of 8)]
[im 1/35]
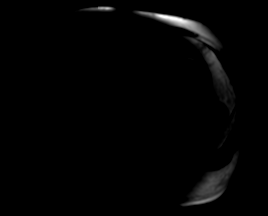

[Series 8: DWI · axial · 6.0mm · 1.49mm/px · 1 of 35 slices shown (4 of 8)]
[im 1/35]
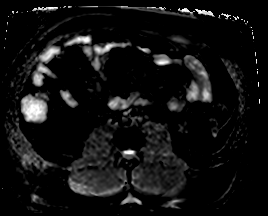

[Series 9: DWI · axial · 6.0mm · 1.49mm/px · 1 of 30 slices shown (5 of 8)]
[im 1/30]
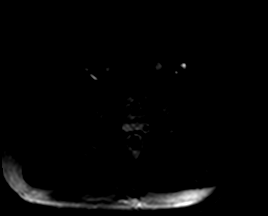

[Series 9: DWI · axial · 6.0mm · 1.49mm/px · 1 of 30 slices shown (6 of 8)]
[im 1/30]
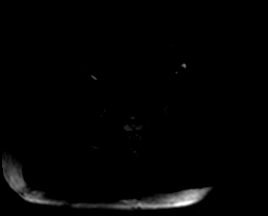

[Series 9: DWI · axial · 6.0mm · 1.49mm/px · 1 of 30 slices shown (7 of 8)]
[im 1/30]
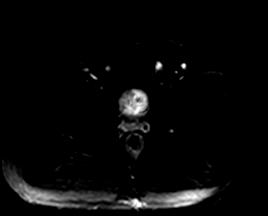

[Series 10: DWI · axial · 6.0mm · 1.49mm/px · 1 of 30 slices shown (8 of 8)]
[im 1/30]
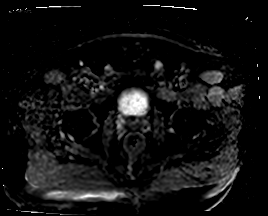

[Series 13: ax haste_comp · axial · 5.0mm · 1.25mm/px · z∈[-302,+115]mm · 3 of 71 slices shown]
[im 1/71]
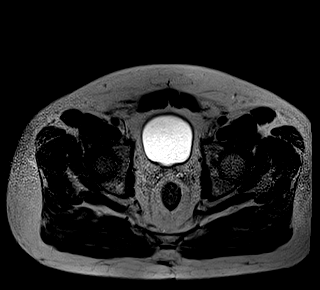
[im 36/71]
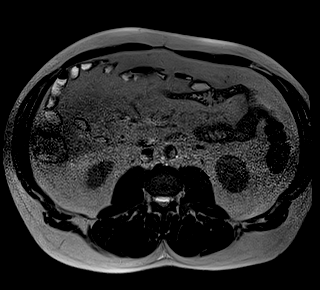
[im 71/71]
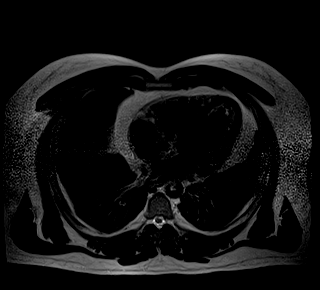

[Series 14: bSSFP · coronal · 4.0mm · 0.98mm/px · 2 of 57 slices shown]
[im 1/57]
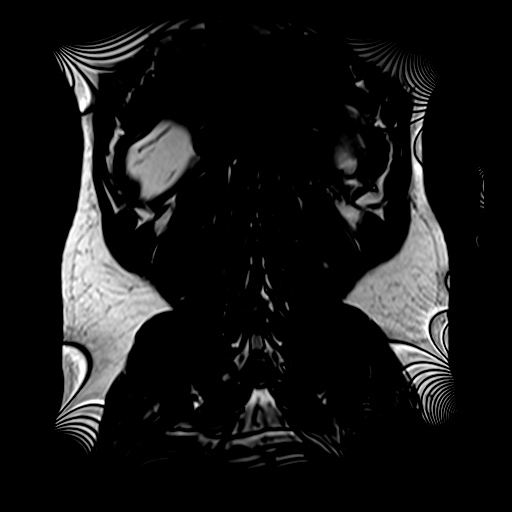
[im 57/57]
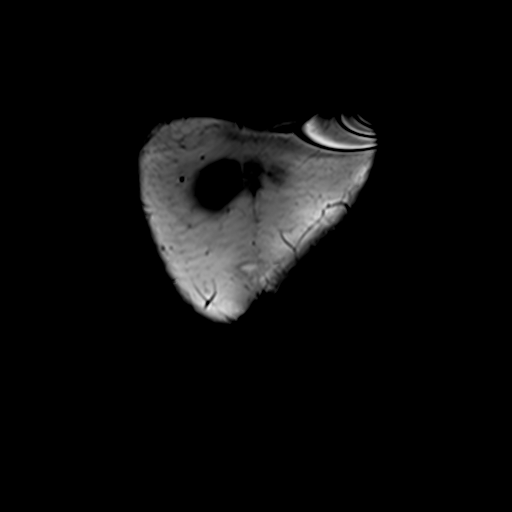

[Series 15: T1 dynamic · axial · 3.0mm · 1.25mm/px · z∈[-121,+116]mm · 3 of 80 slices shown]
[im 1/80]
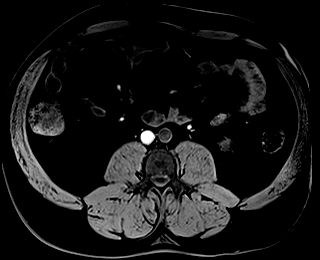
[im 40/80]
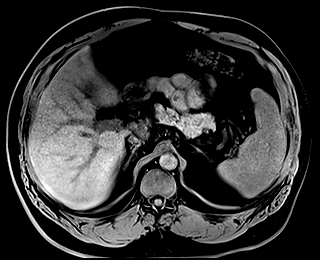
[im 80/80]
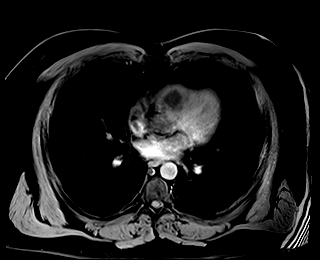

[Series 16: T1 dynamic post-contrast · axial · 3.0mm · 1.25mm/px · z∈[-121,+116]mm · 3 of 80 slices shown (1 of 9)]
[im 1/80]
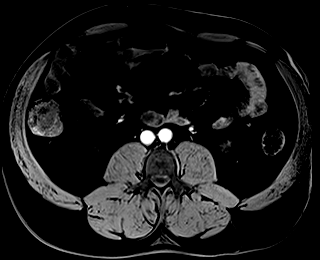
[im 40/80]
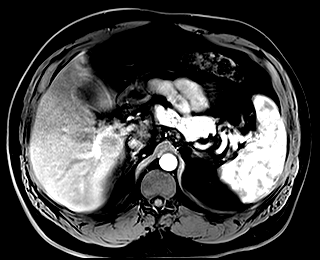
[im 80/80]
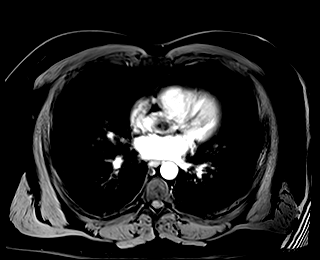

[Series 17: T1 dynamic post-contrast · axial · 3.0mm · 1.25mm/px · z∈[-121,+116]mm · 3 of 80 slices shown (2 of 9)]
[im 1/80]
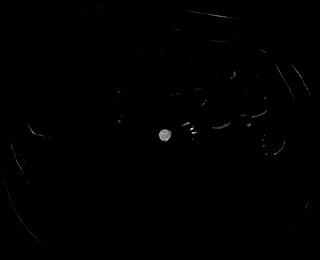
[im 40/80]
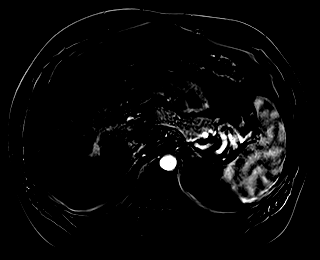
[im 80/80]
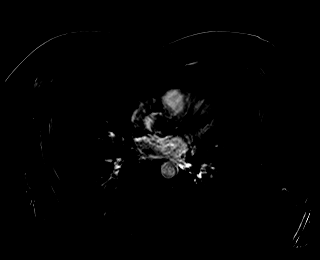

[Series 18: T1 dynamic post-contrast · axial · 3.0mm · 1.25mm/px · z∈[-121,+116]mm · 3 of 80 slices shown (3 of 9)]
[im 1/80]
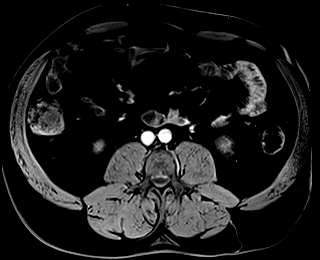
[im 40/80]
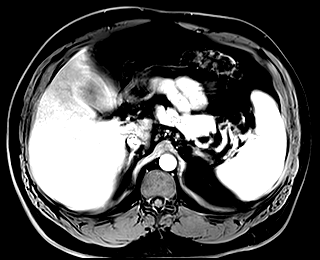
[im 80/80]
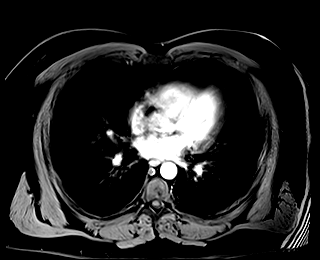

[Series 19: T1 dynamic post-contrast · axial · 3.0mm · 1.25mm/px · z∈[-121,+116]mm · 3 of 80 slices shown (4 of 9)]
[im 1/80]
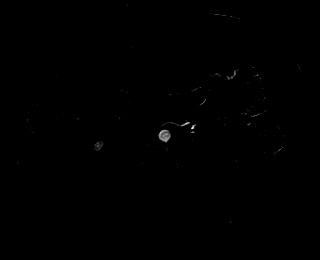
[im 40/80]
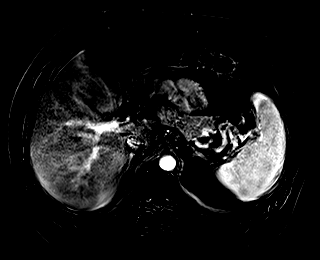
[im 80/80]
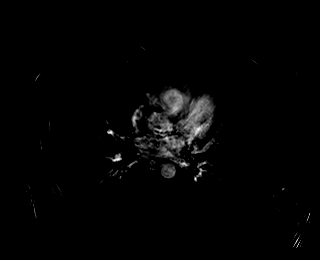

[Series 20: T1 dynamic post-contrast · axial · 3.0mm · 1.25mm/px · z∈[-121,+116]mm · 3 of 80 slices shown (5 of 9)]
[im 1/80]
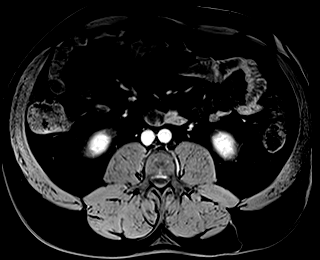
[im 40/80]
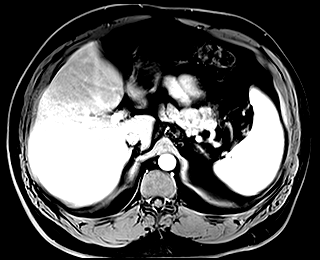
[im 80/80]
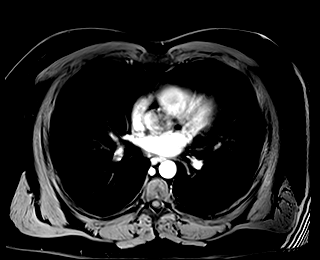

[Series 21: T1 dynamic post-contrast · axial · 3.0mm · 1.25mm/px · z∈[-121,+116]mm · 3 of 80 slices shown (6 of 9)]
[im 1/80]
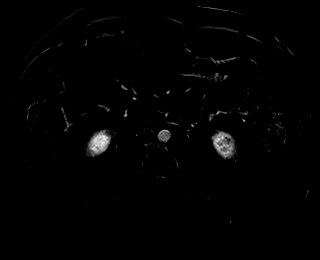
[im 40/80]
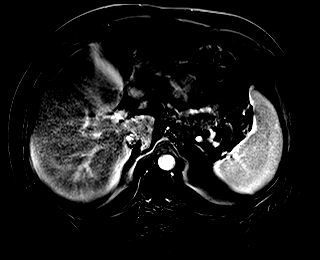
[im 80/80]
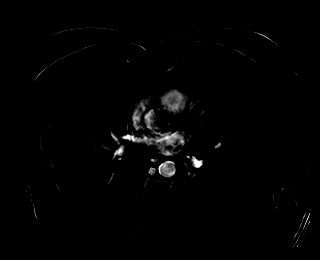

[Series 22: T1 dynamic post-contrast · coronal · 3.0mm · 1.56mm/px · 3 of 72 slices shown (7 of 9)]
[im 1/72]
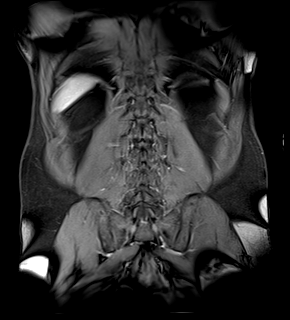
[im 36/72]
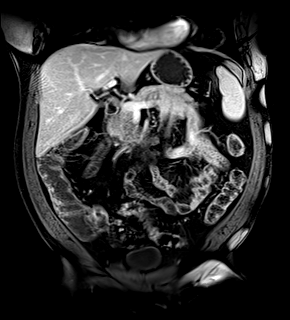
[im 72/72]
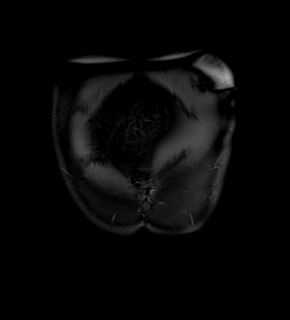

[Series 23: T1 dynamic post-contrast · axial · 3.0mm · 1.25mm/px · z∈[-121,+116]mm · 3 of 80 slices shown (8 of 9)]
[im 1/80]
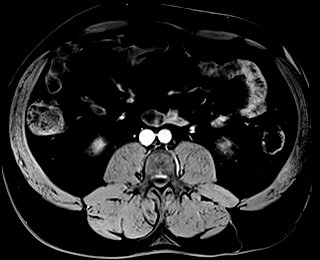
[im 40/80]
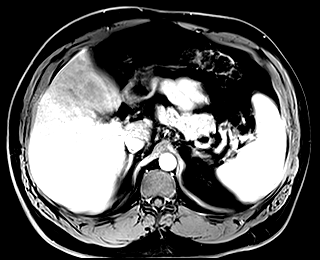
[im 80/80]
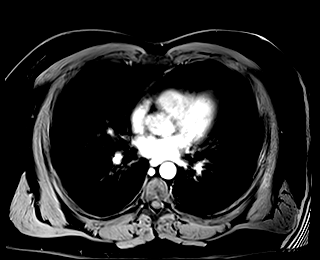

[Series 24: T1 dynamic post-contrast · axial · 3.0mm · 1.25mm/px · z∈[-121,+116]mm · 3 of 80 slices shown (9 of 9)]
[im 1/80]
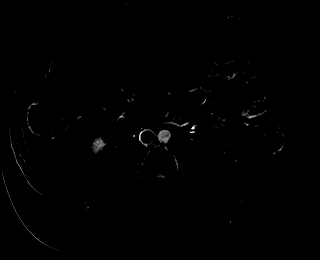
[im 40/80]
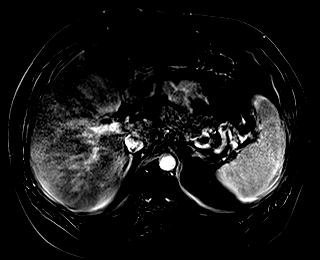
[im 80/80]
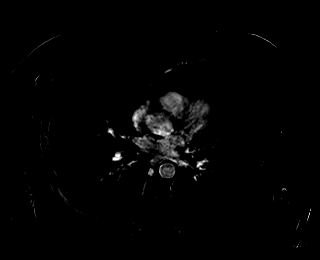

[48 of 48 positions shown; findings below may reference images not displayed]

FINDINGS: COMBINED FINDINGS FOR BOTH MR ABDOMEN AND PELVIS

Lower chest: No acute findings.

Hepatobiliary: No mass or other parenchymal abnormality identified.
No gallstones. No biliary ductal dilatation.

Pancreas: No mass, inflammatory changes, or other parenchymal
abnormality identified.No pancreatic ductal dilatation.

Spleen:  Mild splenomegaly, maximum coronal span 13.8 cm.

Adrenals/Urinary Tract: Normal adrenal glands. No renal masses or
suspicious contrast enhancement identified. No evidence of
hydronephrosis.

Stomach/Bowel: No significant change in configuration of the distal
small bowel following distal ileal resection with two anastomoses at
the ileocecal valve and approximately 13 cm proximally (series 13,
image 53). Patulous and mildly thickened distal remnant ileum,
measuring up to 4.5 cm in caliber, without other evidence of
distended bowel. No acute appearing inflammatory findings or mucosal
hyperenhancement.

Vascular/Lymphatic: No pathologically enlarged lymph nodes
identified. No abdominal aortic aneurysm demonstrated.

Reproductive: Unremarkable partially imaged prostate.

Other:  None.

Musculoskeletal: No suspicious osseous lesions identified.
IMPRESSION: 1. When compared to prior CT dated [DATE], no significant change
in configuration of the distal small bowel following distal ileal
resection with two anastomoses at the ileocecal valve and
approximately 13 cm proximally.
2. Patulous and mildly thickened distal remnant ileum between the
anastomoses, measuring up to 4.5 cm in caliber, without other
evidence of distended bowel proximally.
3. No acute appearing inflammatory findings or mucosal
hyperenhancement to suggest active inflammation at this time. No
evidence of complicating fistula or abscess.
4. Mild splenomegaly.

## 2021-12-08 IMAGING — MR MR ENTEROGRAPHY W/ CM
21 series · 48 of 48 positions shown · IV contrast (gadavist)
Comparison: CT abdomen pelvis, [DATE]

CLINICAL DATA: Small bowel anastomotic stricture

EXAM:
MR ABDOMEN AND PELVIS WITHOUT AND WITH CONTRAST (MR ENTEROGRAPHY)
TECHNIQUE: Multiplanar, multisequence MRI of the abdomen and pelvis was
performed both before and during bolus administration of intravenous
contrast. Negative oral contrast VoLumen was given.
CONTRAST:  10mL GADAVIST GADOBUTROL 1 MMOL/ML IV SOLN, additional
negative oral enteric contrast

[Series 3: cor haste · coronal · 5.0mm · 1.41mm/px · 3 of 50 slices shown]
[im 1/50]
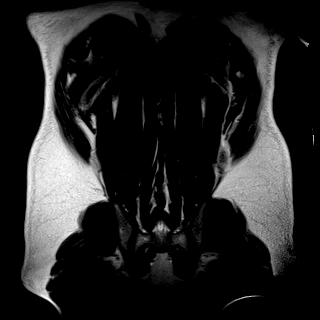
[im 25/50]
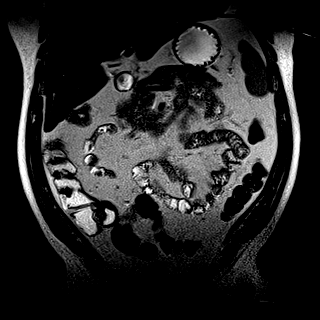
[im 50/50]
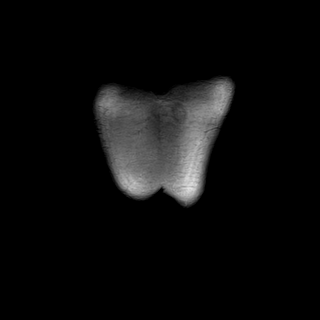

[Series 7: DWI · axial · 6.0mm · 1.49mm/px · z∈[-108,+131]mm · 2 of 35 slices shown (1 of 8)]
[im 1/35]
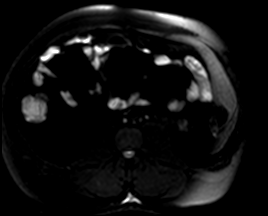
[im 35/35]
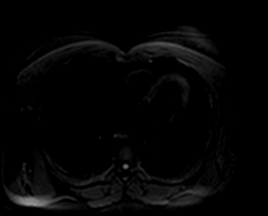

[Series 7: DWI · axial · 6.0mm · 1.49mm/px · z∈[-108,+131]mm · 2 of 35 slices shown (2 of 8)]
[im 1/35]
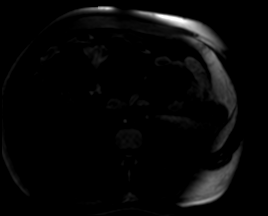
[im 35/35]
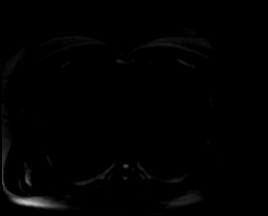

[Series 7: DWI · axial · 6.0mm · 1.49mm/px · 1 of 35 slices shown (3 of 8)]
[im 1/35]
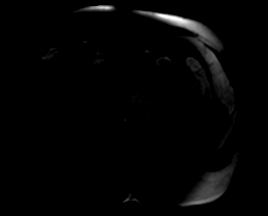

[Series 8: DWI · axial · 6.0mm · 1.49mm/px · 1 of 35 slices shown (4 of 8)]
[im 1/35]
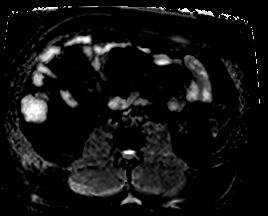

[Series 9: DWI · axial · 6.0mm · 1.49mm/px · 1 of 30 slices shown (5 of 8)]
[im 1/30]
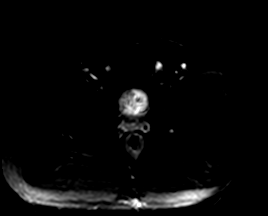

[Series 9: DWI · axial · 6.0mm · 1.49mm/px · 1 of 30 slices shown (6 of 8)]
[im 1/30]
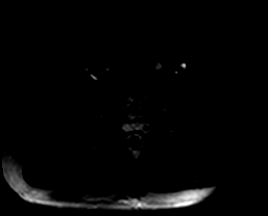

[Series 9: DWI · axial · 6.0mm · 1.49mm/px · 1 of 30 slices shown (7 of 8)]
[im 1/30]
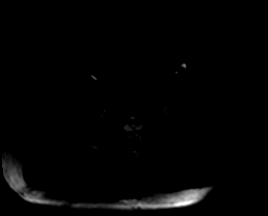

[Series 10: DWI · axial · 6.0mm · 1.49mm/px · 1 of 30 slices shown (8 of 8)]
[im 1/30]
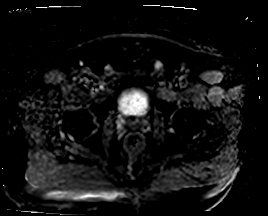

[Series 13: ax haste_comp · axial · 5.0mm · 1.25mm/px · z∈[-302,+115]mm · 3 of 71 slices shown]
[im 1/71]
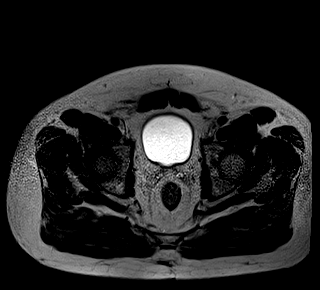
[im 36/71]
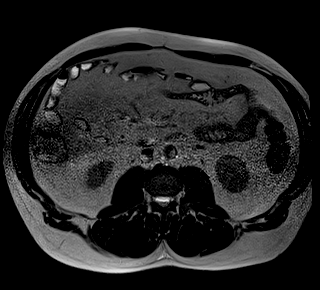
[im 71/71]
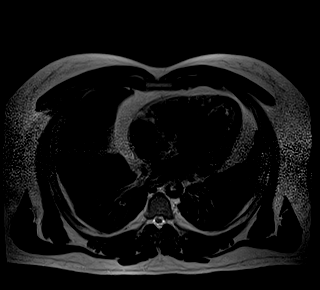

[Series 14: bSSFP · coronal · 4.0mm · 0.98mm/px · 2 of 57 slices shown]
[im 1/57]
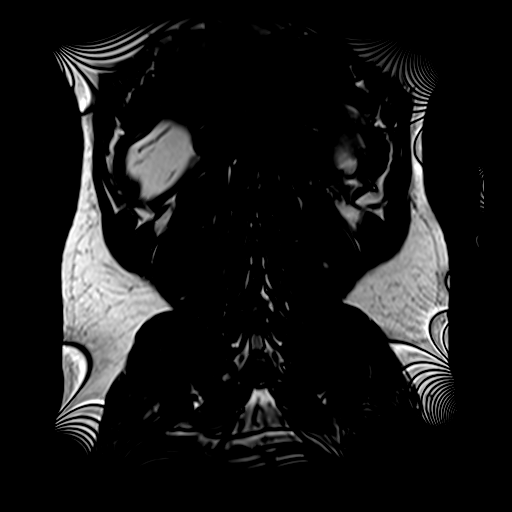
[im 57/57]
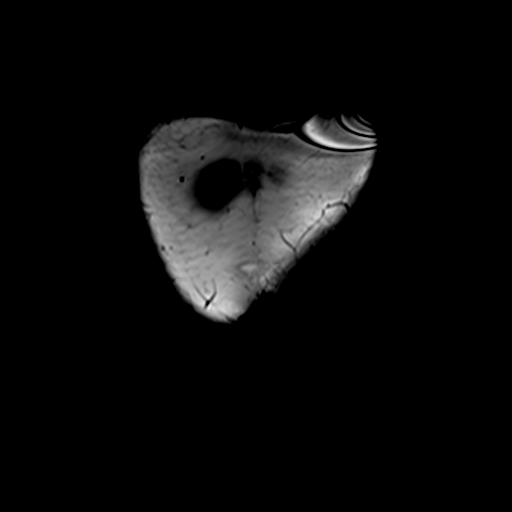

[Series 15: T1 dynamic · axial · 3.0mm · 1.25mm/px · z∈[-121,+116]mm · 3 of 80 slices shown]
[im 1/80]
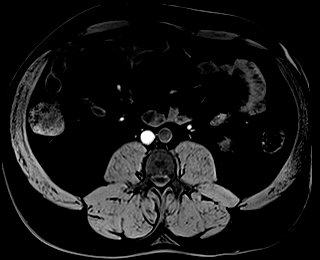
[im 40/80]
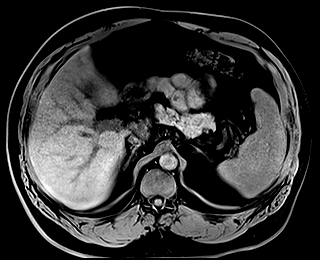
[im 80/80]
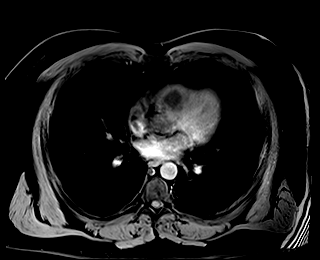

[Series 16: T1 dynamic post-contrast · axial · 3.0mm · 1.25mm/px · z∈[-121,+116]mm · 3 of 80 slices shown (1 of 9)]
[im 1/80]
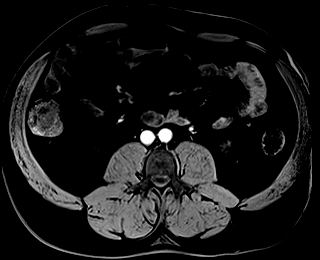
[im 40/80]
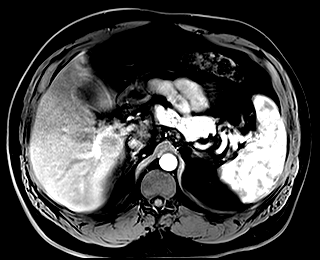
[im 80/80]
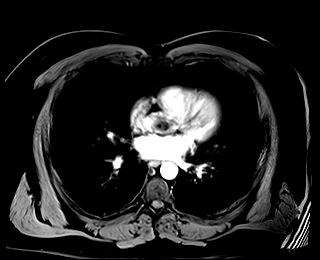

[Series 17: T1 dynamic post-contrast · axial · 3.0mm · 1.25mm/px · z∈[-121,+116]mm · 3 of 80 slices shown (2 of 9)]
[im 1/80]
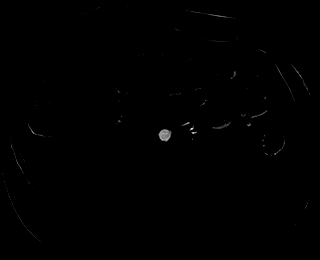
[im 40/80]
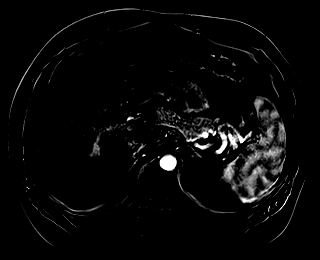
[im 80/80]
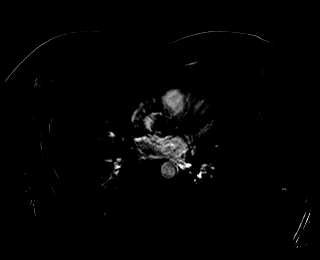

[Series 18: T1 dynamic post-contrast · axial · 3.0mm · 1.25mm/px · z∈[-121,+116]mm · 3 of 80 slices shown (3 of 9)]
[im 1/80]
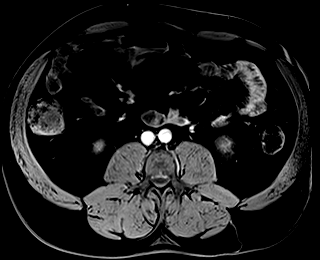
[im 40/80]
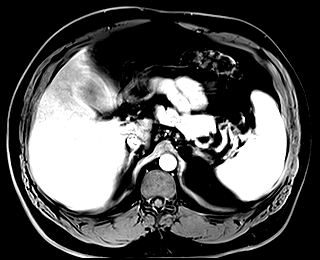
[im 80/80]
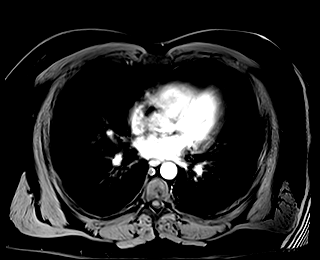

[Series 19: T1 dynamic post-contrast · axial · 3.0mm · 1.25mm/px · z∈[-121,+116]mm · 3 of 80 slices shown (4 of 9)]
[im 1/80]
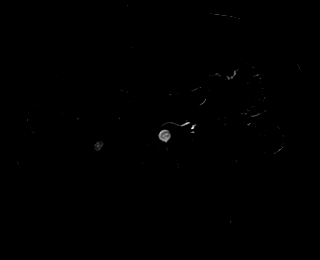
[im 40/80]
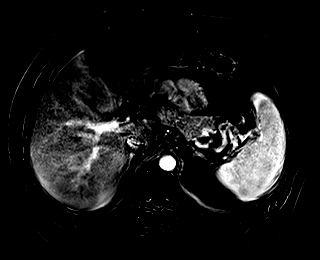
[im 80/80]
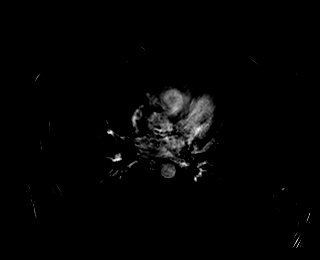

[Series 20: T1 dynamic post-contrast · axial · 3.0mm · 1.25mm/px · z∈[-121,+116]mm · 3 of 80 slices shown (5 of 9)]
[im 1/80]
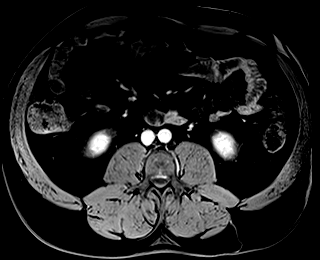
[im 40/80]
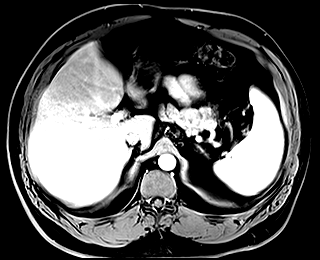
[im 80/80]
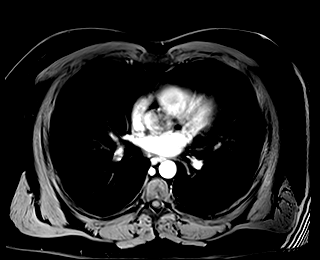

[Series 21: T1 dynamic post-contrast · axial · 3.0mm · 1.25mm/px · z∈[-121,+116]mm · 3 of 80 slices shown (6 of 9)]
[im 1/80]
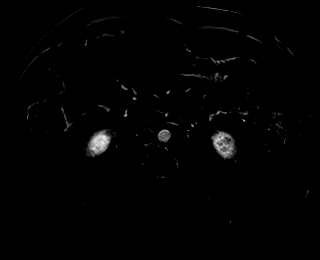
[im 40/80]
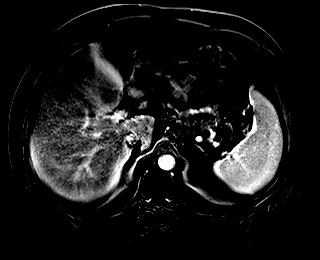
[im 80/80]
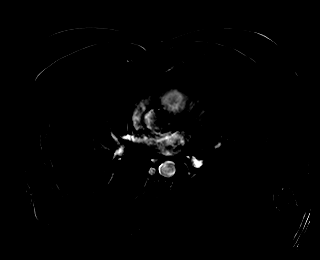

[Series 22: T1 dynamic post-contrast · coronal · 3.0mm · 1.56mm/px · 3 of 72 slices shown (7 of 9)]
[im 1/72]
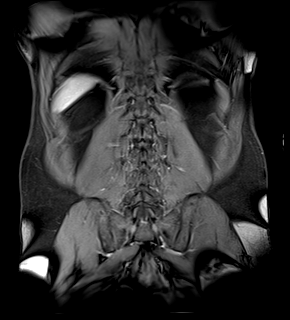
[im 36/72]
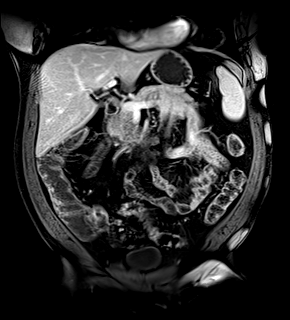
[im 72/72]
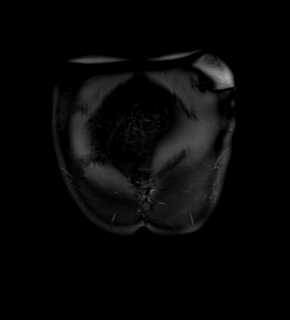

[Series 23: T1 dynamic post-contrast · axial · 3.0mm · 1.25mm/px · z∈[-121,+116]mm · 3 of 80 slices shown (8 of 9)]
[im 1/80]
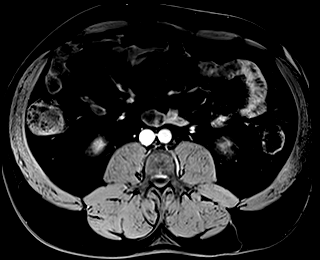
[im 40/80]
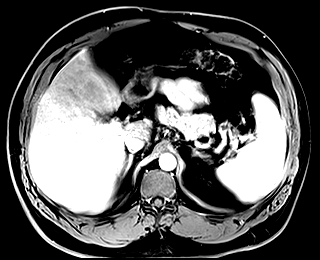
[im 80/80]
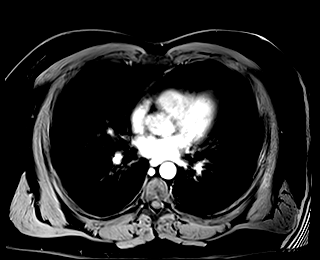

[Series 24: T1 dynamic post-contrast · axial · 3.0mm · 1.25mm/px · z∈[-121,+116]mm · 3 of 80 slices shown (9 of 9)]
[im 1/80]
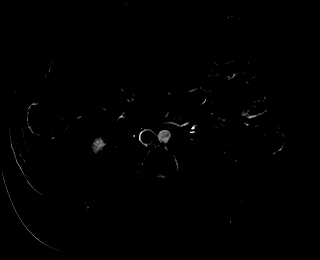
[im 40/80]
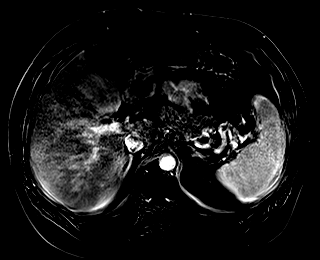
[im 80/80]
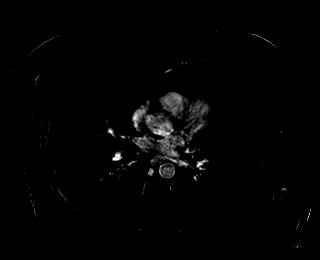

[48 of 48 positions shown; findings below may reference images not displayed]

FINDINGS: COMBINED FINDINGS FOR BOTH MR ABDOMEN AND PELVIS

Lower chest: No acute findings.

Hepatobiliary: No mass or other parenchymal abnormality identified.
No gallstones. No biliary ductal dilatation.

Pancreas: No mass, inflammatory changes, or other parenchymal
abnormality identified.No pancreatic ductal dilatation.

Spleen:  Mild splenomegaly, maximum coronal span 13.8 cm.

Adrenals/Urinary Tract: Normal adrenal glands. No renal masses or
suspicious contrast enhancement identified. No evidence of
hydronephrosis.

Stomach/Bowel: No significant change in configuration of the distal
small bowel following distal ileal resection with two anastomoses at
the ileocecal valve and approximately 13 cm proximally (series 13,
image 53). Patulous and mildly thickened distal remnant ileum,
measuring up to 4.5 cm in caliber, without other evidence of
distended bowel. No acute appearing inflammatory findings or mucosal
hyperenhancement.

Vascular/Lymphatic: No pathologically enlarged lymph nodes
identified. No abdominal aortic aneurysm demonstrated.

Reproductive: Unremarkable partially imaged prostate.

Other:  None.

Musculoskeletal: No suspicious osseous lesions identified.
IMPRESSION: 1. When compared to prior CT dated [DATE], no significant change
in configuration of the distal small bowel following distal ileal
resection with two anastomoses at the ileocecal valve and
approximately 13 cm proximally.
2. Patulous and mildly thickened distal remnant ileum between the
anastomoses, measuring up to 4.5 cm in caliber, without other
evidence of distended bowel proximally.
3. No acute appearing inflammatory findings or mucosal
hyperenhancement to suggest active inflammation at this time. No
evidence of complicating fistula or abscess.
4. Mild splenomegaly.

## 2021-12-08 MED ORDER — GLUCAGON HCL RDNA (DIAGNOSTIC) 1 MG IJ SOLR
INTRAMUSCULAR | Status: AC
  Administered 2021-12-08: 1 mg via INTRAVENOUS
  Filled 2021-12-08: qty 1

## 2021-12-08 MED ORDER — GADOBUTROL 1 MMOL/ML IV SOLN
10.0000 mL | Freq: Once | INTRAVENOUS | Status: AC | PRN
  Administered 2021-12-08: 10 mL via INTRAVENOUS

## 2021-12-28 ENCOUNTER — Encounter (INDEPENDENT_AMBULATORY_CARE_PROVIDER_SITE_OTHER): Payer: Self-pay | Admitting: Gastroenterology

## 2022-03-26 ENCOUNTER — Ambulatory Visit (INDEPENDENT_AMBULATORY_CARE_PROVIDER_SITE_OTHER): Payer: 59 | Admitting: Gastroenterology

## 2022-04-09 ENCOUNTER — Ambulatory Visit (INDEPENDENT_AMBULATORY_CARE_PROVIDER_SITE_OTHER): Payer: 59 | Admitting: Gastroenterology

## 2022-04-09 ENCOUNTER — Encounter (INDEPENDENT_AMBULATORY_CARE_PROVIDER_SITE_OTHER): Payer: Self-pay | Admitting: Gastroenterology
